# Patient Record
Sex: Female | Born: 1985
Health system: Southern US, Community
[De-identification: ages and names within clinical notes are randomized; demographics above are authoritative.]

## PROBLEM LIST (undated history)

## (undated) DIAGNOSIS — M549 Dorsalgia, unspecified: Secondary | ICD-10-CM

## (undated) DIAGNOSIS — J45909 Unspecified asthma, uncomplicated: Secondary | ICD-10-CM

## (undated) DIAGNOSIS — G43909 Migraine, unspecified, not intractable, without status migrainosus: Secondary | ICD-10-CM

## (undated) DIAGNOSIS — K3184 Gastroparesis: Secondary | ICD-10-CM

## (undated) DIAGNOSIS — G8929 Other chronic pain: Secondary | ICD-10-CM

## (undated) HISTORY — DX: Other chronic pain: G89.29

## (undated) HISTORY — DX: Dorsalgia, unspecified: M54.9

## (undated) HISTORY — DX: Migraine, unspecified, not intractable, without status migrainosus: G43.909

## (undated) HISTORY — PX: WISDOM TOOTH EXTRACTION: SHX21

## (undated) HISTORY — PX: CYST EXCISION: SHX5701

## (undated) HISTORY — PX: OTHER SURGICAL HISTORY: SHX169

## (undated) HISTORY — DX: Unspecified asthma, uncomplicated: J45.909

---

## 2011-07-01 ENCOUNTER — Emergency Department (HOSPITAL_COMMUNITY): Payer: BC Managed Care – PPO

## 2011-07-01 ENCOUNTER — Emergency Department (HOSPITAL_COMMUNITY)
Admission: EM | Admit: 2011-07-01 | Discharge: 2011-07-01 | Disposition: A | Discharge status: Home / Self Care | Payer: BC Managed Care – PPO | Attending: Emergency Medicine | Admitting: Emergency Medicine

## 2011-07-01 DIAGNOSIS — R05 Cough: Secondary | ICD-10-CM | POA: Insufficient documentation

## 2011-07-01 DIAGNOSIS — R059 Cough, unspecified: Secondary | ICD-10-CM | POA: Insufficient documentation

## 2011-07-01 DIAGNOSIS — J40 Bronchitis, not specified as acute or chronic: Secondary | ICD-10-CM | POA: Insufficient documentation

## 2011-07-01 DIAGNOSIS — R0602 Shortness of breath: Secondary | ICD-10-CM | POA: Insufficient documentation

## 2013-02-04 ENCOUNTER — Other Ambulatory Visit: Payer: Self-pay | Admitting: Orthopedic Surgery

## 2013-02-04 DIAGNOSIS — M545 Low back pain, unspecified: Secondary | ICD-10-CM

## 2013-02-05 ENCOUNTER — Other Ambulatory Visit: Payer: BC Managed Care – PPO

## 2013-02-06 ENCOUNTER — Ambulatory Visit
Admission: RE | Admit: 2013-02-06 | Discharge: 2013-02-06 | Disposition: A | Discharge status: Home / Self Care | Payer: BC Managed Care – PPO | Source: Ambulatory Visit | Attending: Orthopedic Surgery | Admitting: Orthopedic Surgery

## 2013-02-06 DIAGNOSIS — M545 Low back pain, unspecified: Secondary | ICD-10-CM

## 2014-07-30 ENCOUNTER — Emergency Department (HOSPITAL_COMMUNITY): Payer: BC Managed Care – PPO

## 2014-07-30 ENCOUNTER — Encounter (HOSPITAL_COMMUNITY): Payer: Self-pay | Admitting: Emergency Medicine

## 2014-07-30 ENCOUNTER — Emergency Department (HOSPITAL_COMMUNITY)
Admission: EM | Admit: 2014-07-30 | Discharge: 2014-07-30 | Disposition: A | Discharge status: Home / Self Care | Payer: BC Managed Care – PPO | Attending: Emergency Medicine | Admitting: Emergency Medicine

## 2014-07-30 DIAGNOSIS — R1012 Left upper quadrant pain: Secondary | ICD-10-CM | POA: Diagnosis present

## 2014-07-30 DIAGNOSIS — R112 Nausea with vomiting, unspecified: Secondary | ICD-10-CM | POA: Insufficient documentation

## 2014-07-30 DIAGNOSIS — M549 Dorsalgia, unspecified: Secondary | ICD-10-CM | POA: Insufficient documentation

## 2014-07-30 DIAGNOSIS — Z79899 Other long term (current) drug therapy: Secondary | ICD-10-CM | POA: Insufficient documentation

## 2014-07-30 DIAGNOSIS — J45909 Unspecified asthma, uncomplicated: Secondary | ICD-10-CM | POA: Insufficient documentation

## 2014-07-30 DIAGNOSIS — IMO0002 Reserved for concepts with insufficient information to code with codable children: Secondary | ICD-10-CM | POA: Diagnosis not present

## 2014-07-30 DIAGNOSIS — Z3202 Encounter for pregnancy test, result negative: Secondary | ICD-10-CM | POA: Diagnosis not present

## 2014-07-30 DIAGNOSIS — Z8719 Personal history of other diseases of the digestive system: Secondary | ICD-10-CM | POA: Diagnosis not present

## 2014-07-30 HISTORY — DX: Gastroparesis: K31.84

## 2014-07-30 HISTORY — DX: Unspecified asthma, uncomplicated: J45.909

## 2014-07-30 LAB — CBC WITH DIFFERENTIAL/PLATELET
Basophils Absolute: 0 10*3/uL (ref 0.0–0.1)
Basophils Relative: 0 % (ref 0–1)
Eosinophils Absolute: 0.1 10*3/uL (ref 0.0–0.7)
Eosinophils Relative: 1 % (ref 0–5)
HCT: 36.3 % (ref 36.0–46.0)
Hemoglobin: 11.8 g/dL — ABNORMAL LOW (ref 12.0–15.0)
Lymphocytes Relative: 24 % (ref 12–46)
Lymphs Abs: 2 10*3/uL (ref 0.7–4.0)
MCH: 28.5 pg (ref 26.0–34.0)
MCHC: 32.5 g/dL (ref 30.0–36.0)
MCV: 87.7 fL (ref 78.0–100.0)
Monocytes Absolute: 0.5 10*3/uL (ref 0.1–1.0)
Monocytes Relative: 6 % (ref 3–12)
Neutro Abs: 5.9 10*3/uL (ref 1.7–7.7)
Neutrophils Relative %: 69 % (ref 43–77)
Platelets: 258 10*3/uL (ref 150–400)
RBC: 4.14 MIL/uL (ref 3.87–5.11)
RDW: 12.5 % (ref 11.5–15.5)
WBC: 8.5 10*3/uL (ref 4.0–10.5)

## 2014-07-30 LAB — COMPREHENSIVE METABOLIC PANEL
ALT: 9 U/L (ref 0–35)
AST: 11 U/L (ref 0–37)
Albumin: 4 g/dL (ref 3.5–5.2)
Alkaline Phosphatase: 55 U/L (ref 39–117)
Anion gap: 17 — ABNORMAL HIGH (ref 5–15)
BUN: 12 mg/dL (ref 6–23)
CO2: 23 mEq/L (ref 19–32)
Calcium: 9.6 mg/dL (ref 8.4–10.5)
Chloride: 98 mEq/L (ref 96–112)
Creatinine, Ser: 0.76 mg/dL (ref 0.50–1.10)
GFR calc Af Amer: >90 mL/min (ref >90)
GFR calc non Af Amer: >90 mL/min (ref >90)
Glucose, Bld: 85 mg/dL (ref 70–99)
Potassium: 3.3 mEq/L — ABNORMAL LOW (ref 3.7–5.3)
Sodium: 138 mEq/L (ref 137–147)
Total Bilirubin: <0.2 mg/dL — ABNORMAL LOW (ref 0.3–1.2)
Total Protein: 7.6 g/dL (ref 6.0–8.3)

## 2014-07-30 LAB — URINALYSIS, ROUTINE W REFLEX MICROSCOPIC
Glucose, UA: NEGATIVE mg/dL
Hgb urine dipstick: NEGATIVE
Ketones, ur: NEGATIVE mg/dL
Nitrite: NEGATIVE
Protein, ur: NEGATIVE mg/dL
Specific Gravity, Urine: 1.029 (ref 1.005–1.030)
Urobilinogen, UA: 1 mg/dL (ref 0.0–1.0)
pH: 6 (ref 5.0–8.0)

## 2014-07-30 LAB — POC URINE PREG, ED: Preg Test, Ur: NEGATIVE

## 2014-07-30 LAB — URINE MICROSCOPIC-ADD ON

## 2014-07-30 LAB — LIPASE, BLOOD: Lipase: 30 U/L (ref 11–59)

## 2014-07-30 MED ORDER — KETOROLAC TROMETHAMINE 60 MG/2ML IM SOLN
60.0000 mg | Freq: Once | INTRAMUSCULAR | Status: AC
  Administered 2014-07-30: 60 mg via INTRAMUSCULAR
  Filled 2014-07-30: qty 2

## 2014-07-30 MED ORDER — ONDANSETRON 8 MG PO TBDP
8.0000 mg | ORAL_TABLET | Freq: Once | ORAL | Status: AC
  Administered 2014-07-30: 8 mg via ORAL
  Filled 2014-07-30: qty 1

## 2014-07-30 MED ORDER — HYDROCODONE-ACETAMINOPHEN 5-325 MG PO TABS
1.0000 | ORAL_TABLET | Freq: Once | ORAL | Status: AC
  Administered 2014-07-30: 1 via ORAL
  Filled 2014-07-30: qty 1

## 2014-07-30 MED ORDER — ONDANSETRON HCL 4 MG PO TABS: 4.0000 mg | ORAL_TABLET | Freq: Three times a day (TID) | ORAL | Status: DC | PRN

## 2014-07-30 MED ORDER — ONDANSETRON HCL 4 MG/2ML IJ SOLN
4.0000 mg | Freq: Once | INTRAMUSCULAR | Status: AC
  Administered 2014-07-30: 4 mg via INTRAVENOUS
  Filled 2014-07-30: qty 2

## 2014-07-30 MED ORDER — IOHEXOL 300 MG/ML  SOLN
50.0000 mL | Freq: Once | INTRAMUSCULAR | Status: AC | PRN
  Administered 2014-07-30: 50 mL via ORAL

## 2014-07-30 MED ORDER — ONDANSETRON 4 MG PO TBDP: ORAL_TABLET | ORAL | Status: DC

## 2014-07-30 MED ORDER — HYDROCODONE-ACETAMINOPHEN 5-325 MG PO TABS: ORAL_TABLET | ORAL | Status: DC

## 2014-07-30 MED ORDER — IOHEXOL 300 MG/ML  SOLN
100.0000 mL | Freq: Once | INTRAMUSCULAR | Status: AC | PRN
  Administered 2014-07-30: 100 mL via INTRAVENOUS

## 2014-07-30 NOTE — ED Notes (Signed)
Patient is aware that we need a urine specimen- patient is currently drinking contrast for CT scan. Will let us know

## 2014-07-30 NOTE — ED Provider Notes (Signed)
Medical screening examination/treatment/procedure(s) were performed by non-physician practitioner and as supervising physician I was immediately available for consultation/collaboration.   EKG Interpretation None        Layla Maw Sahej Hauswirth, DO 07/30/14 1941

## 2014-07-30 NOTE — ED Provider Notes (Signed)
PROGRESS NOTE                                                                                                                 This is a sign-out from PA [employer] [place] at shift change: Michelle Braun is a 28 y.o. female presenting with LUQ abd pain and 4 days of vomitting. Plan is to f/u CT and PO challenge.  Please refer to previous note for full HPI, ROS, PMH and PE.   CT with no abnormalities. Patient seen and examined at the bedside, she is resting comfortably, in no pain. Heart is a regular rate and rhythm with no murmurs rubs or gallops, lung sounds are clear to auscultation bilaterally, abdominal exam is nontender to palpation of any quadrant, she has normal active bowel sounds, no guarding or rebound. No CVA tenderness bilaterally. Advised patient to follow closely with both primary care and her gastroenterologist at [facility]. Patient denies any dysuria, hematuria, urinary frequency. Patient was highly contaminated urinalysis, no signs or symptoms of UTI. WIll culture her urine, defer treatment. =  Filed Vitals:   07/30/14 1326  BP: 115/64  Pulse: 96  Temp: 97.8 F (36.6 C)  TempSrc: Oral  Resp: 18  SpO2: 100%    Medications  ondansetron (ZOFRAN) injection 4 mg (4 mg Intravenous Given 07/30/14 1502)  ketorolac (TORADOL) injection 60 mg (60 mg Intramuscular Given 07/30/14 1502)  iohexol (OMNIPAQUE) 300 MG/ML solution 50 mL (50 mLs Oral Contrast Given 07/30/14 1636)  ondansetron (ZOFRAN) injection 4 mg (4 mg Intravenous Given 07/30/14 1636)  iohexol (OMNIPAQUE) 300 MG/ML solution 100 mL (100 mLs Intravenous Contrast Given 07/30/14 1643)    Evaluation does not show pathology that would require ongoing emergent intervention or inpatient treatment. Pt is hemodynamically stable and mentating appropriately. Discussed findings and plan with patient/guardian, who agrees with care plan. All questions answered. Return precautions discussed and outpatient follow up given.   New Prescriptions   HYDROCODONE-ACETAMINOPHEN (NORCO/VICODIN) 5-325 MG PER TABLET    Take 1-2 tablets by mouth every 6 hours as needed for pain.            Wynetta Emery, PA-C 07/30/14 1735

## 2014-07-30 NOTE — ED Notes (Signed)
Pt states she can not void at this time due to the fact that she " hasn't really had any fluids."

## 2014-07-30 NOTE — ED Notes (Signed)
Pt c/o LUQ pain x 1 month w/ vomiting.  Has been seeing a doc but has not been dx w/ anything.  Has an appt w/ GI on Friday but was told by her PCP to come to the ER d/t pain.

## 2014-07-30 NOTE — ED Provider Notes (Signed)
CSN: [id number]     Arrival date & time 07/30/14  1301 History   First MD Initiated Contact with Patient 07/30/14 1427     Chief Complaint  Patient presents with  . Abdominal Pain  . Emesis    HPI Michelle Braun is a 28 y.o. female with PMH of gastroparesis who complaining of intermittent crampy diffuse abdominal pain which has become sharp, persistent and is worsening today. Pain is in LUQ, it does not radiate. He has nausea and vomiting as well and has not been able to keep food or drink down since Monday. She can't remember the last time she had a BM. Pain is not increased with food, breathing or movement. She has taken ondansetron, promethazine, nexium, and reglan without relief. Carafe helps with the abdominal pain. She has had gastroparesis since she was 28 years old and has had it every 7 years or so and Reglan has helped but it is not helping today. Patient has GI appointment on Friday with GI. Patient without vaginal discharge or bleeding. No urinary complaints. She does have chronic back problem for which she takes oxycodone.  Past Medical History  Diagnosis Date  . Gastroparesis   . Asthma    History reviewed. No pertinent past surgical history. History reviewed. No pertinent family history. History  Substance Use Topics  . Smoking status: Never Smoker   . Smokeless tobacco: Not on file  . Alcohol Use: No   OB History   Grav Para Term Preterm Abortions TAB SAB Ect Mult Living                 Review of Systems  Constitutional: Negative for fever and chills.  HENT: Negative for congestion and rhinorrhea.   Eyes: Negative for visual disturbance.  Respiratory: Negative for cough and shortness of breath.   Cardiovascular: Negative for palpitations.  Gastrointestinal: Positive for nausea, vomiting and abdominal pain. Negative for diarrhea, blood in stool and anal bleeding.  Genitourinary: Negative for dysuria and hematuria.  Musculoskeletal: Positive for back pain. Negative  for gait problem.  Skin: Negative for rash.  Neurological: Negative for weakness and headaches.      Allergies  Tramadol  Home Medications   Prior to Admission medications   Medication Sig Start Date End Date Taking? Authorizing Provider  albuterol (PROVENTIL HFA;VENTOLIN HFA) 108 (90 BASE) MCG/ACT inhaler Inhale 2 puffs into the lungs every 4 (four) hours as needed for wheezing or shortness of breath.   Yes Historical Provider, MD  atenolol (TENORMIN) 50 MG tablet Take 50 mg by mouth daily as needed (blood pressure).   Yes Historical Provider, MD  beclomethasone (QVAR) 80 MCG/ACT inhaler Inhale 2 puffs into the lungs 2 (two) times daily.   Yes Historical Provider, MD  Bepotastine Besilate (BEPREVE) 1.5 % SOLN Apply 1 drop to eye 2 (two) times daily.   Yes Historical Provider, MD  buPROPion (WELLBUTRIN XL) 300 MG 24 hr tablet Take 300 mg by mouth every morning.   Yes Historical Provider, MD  cyclobenzaprine (FLEXERIL) 10 MG tablet Take 10 mg by mouth 3 (three) times daily as needed for muscle spasms.   Yes Historical Provider, MD  EPINEPHrine (EPIPEN) 0.3 mg/0.3 mL IJ SOAJ injection Inject 0.3 mg into the muscle once as needed (allergic reaction.).   Yes Historical Provider, MD  esomeprazole (NEXIUM) 40 MG capsule Take 40 mg by mouth daily at 12 noon.   Yes Historical Provider, MD  etonogestrel-ethinyl estradiol (NUVARING) 0.12-0.015 MG/24HR vaginal ring Place 1 each  vaginally every 28 (twenty-eight) days. Insert vaginally and leave in place for 3 consecutive weeks, then remove for 1 week.   Yes Historical Provider, MD  levocetirizine (XYZAL) 5 MG tablet Take 5 mg by mouth every evening.   Yes Historical Provider, MD  lidocaine (LIDODERM) 5 % Place 1-3 patches onto the skin daily as needed (pain). Remove & Discard patch within 12 hours or as directed by MD   Yes Historical Provider, MD  lisdexamfetamine (VYVANSE) 60 MG capsule Take 60 mg by mouth every morning.   Yes Historical Provider, MD   meloxicam (MOBIC) 15 MG tablet Take 15 mg by mouth at bedtime.   Yes Historical Provider, MD  metoCLOPramide (REGLAN) 10 MG tablet Take 10 mg by mouth See admin instructions. Thirty minutes before each meal as needed for stomach upset.   Yes Historical Provider, MD  mometasone (NASONEX) 50 MCG/ACT nasal spray Place 2 sprays into the nose daily.   Yes Historical Provider, MD  montelukast (SINGULAIR) 10 MG tablet Take 10 mg by mouth at bedtime.   Yes Historical Provider, MD  ondansetron (ZOFRAN) 8 MG tablet Take 8 mg by mouth every 8 (eight) hours as needed for nausea or vomiting.   Yes Historical Provider, MD  oxyCODONE-acetaminophen (PERCOCET) 7.5-325 MG per tablet Take 1 tablet by mouth every 8 (eight) hours as needed for pain.   Yes Historical Provider, MD  promethazine (PHENERGAN) 25 MG tablet Take 25 mg by mouth every 8 (eight) hours as needed for nausea or vomiting.   Yes Historical Provider, MD  ranitidine (ZANTAC) 75 MG tablet Take 75 mg by mouth daily as needed for heartburn.   Yes Historical Provider, MD  sertraline (ZOLOFT) 100 MG tablet Take 200 mg by mouth every morning.   Yes Historical Provider, MD  sucralfate (CARAFATE) 1 GM/10ML suspension Take 1 g by mouth 4 (four) times daily.   Yes Historical Provider, MD  HYDROcodone-acetaminophen (NORCO/VICODIN) 5-325 MG per tablet Take 1-2 tablets by mouth every 6 hours as needed for pain. 07/30/14   Nicole Pisciotta, PA-C   BP 115/64  Pulse 96  Temp(Src) 97.8 F (36.6 C) (Oral)  Resp 18  SpO2 100%  LMP 06/29/2014 Physical Exam  Nursing note and vitals reviewed. Constitutional: She appears well-developed and well-nourished. No distress.  HENT:  Head: Normocephalic and atraumatic.  Eyes: Conjunctivae and EOM are normal. Right eye exhibits no discharge. Left eye exhibits no discharge. No scleral icterus.  Cardiovascular: Normal rate, regular rhythm and normal heart sounds.   Pulmonary/Chest: Effort normal and breath sounds normal. No  respiratory distress. She has no wheezes.  Abdominal: Soft. Bowel sounds are normal. She exhibits no distension.  LUQ tenderness without guarding, rebound or signs of peritonitis. No CVA tenderness.   Musculoskeletal: Normal range of motion. She exhibits no tenderness.  Neurological: She is alert. She exhibits normal muscle tone. Coordination normal.  Skin: Skin is warm and dry. She is not diaphoretic.  Psychiatric: She has a normal mood and affect. Her behavior is normal.    ED Course  Procedures (including critical care time) Labs Review Labs Reviewed  CBC WITH DIFFERENTIAL - Abnormal; Notable for the following:    Hemoglobin 11.8 (*)    All other components within normal limits  COMPREHENSIVE METABOLIC PANEL - Abnormal; Notable for the following:    Potassium 3.3 (*)    Total Bilirubin <0.2 (*)    Anion gap 17 (*)    All other components within normal limits  URINALYSIS, ROUTINE W  REFLEX MICROSCOPIC - Abnormal; Notable for the following:    Color, Urine AMBER (*)    APPearance CLOUDY (*)    Bilirubin Urine SMALL (*)    Leukocytes, UA LARGE (*)    All other components within normal limits  URINE MICROSCOPIC-ADD ON - Abnormal; Notable for the following:    Squamous Epithelial / LPF MANY (*)    Bacteria, UA FEW (*)    Crystals CA OXALATE CRYSTALS (*)    All other components within normal limits  LIPASE, BLOOD  POC URINE PREG, ED    Imaging Review Ct Abdomen Pelvis W Contrast  07/30/2014   CLINICAL DATA:  Left upper quadrant pain for 1 month vomiting.  EXAM: CT ABDOMEN AND PELVIS WITH CONTRAST  TECHNIQUE: Multidetector CT imaging of the abdomen and pelvis was performed using the standard protocol following bolus administration of intravenous contrast.  CONTRAST:  100 mL OMNIPAQUE IOHEXOL 300 MG/ML  SOLN  COMPARISON:  None.  FINDINGS: The lung bases are clear.  No pleural or pericardial effusion.  The gallbladder, liver, spleen, adrenal glands, pancreas and kidneys appear normal.  Nuvaring is in place in the cervix. Uterus and adnexa are otherwise unremarkable. Urinary bladder appears normal. The stomach, small and large bowel and appendix appear normal. There is no lymphadenopathy or fluid. No bony abnormality is identified.  IMPRESSION: No finding to explain the patient's symptoms. Negative CT abdomen and pelvis.   Electronically Signed   By: Drusilla Kanner M.D.   On: 07/30/2014 16:57     EKG Interpretation None     Meds given in ED:  Medications  ondansetron (ZOFRAN) injection 4 mg (4 mg Intravenous Given 07/30/14 1502)  ketorolac (TORADOL) injection 60 mg (60 mg Intramuscular Given 07/30/14 1502)  iohexol (OMNIPAQUE) 300 MG/ML solution 50 mL (50 mLs Oral Contrast Given 07/30/14 1636)  ondansetron (ZOFRAN) injection 4 mg (4 mg Intravenous Given 07/30/14 1636)  iohexol (OMNIPAQUE) 300 MG/ML solution 100 mL (100 mLs Intravenous Contrast Given 07/30/14 1643)    New Prescriptions   HYDROCODONE-ACETAMINOPHEN (NORCO/VICODIN) 5-325 MG PER TABLET    Take 1-2 tablets by mouth every 6 hours as needed for pain.      MDM   Final diagnoses:  LUQ abdominal pain  Nausea and vomiting, vomiting of unspecified type   Michelle Braun is a 28 y.o. female complaining of LUQ pain that is sharp, persistent and worsening. Patient has had intermittent crampy abdominal pain for a month. She reports three days of nausea and vomiting and has not been able to eat or drink. Patient is afebrile with VSS. Patient in no distress. Patient given zofran and toradol in ED with improvement of nausea and pain. CT of abdomen pelvis pending. Patient with GI follow up appointment scheduled for Friday.  Repeat abdominal exam with decreased tenderness in LUQ. Patient has ingested half of the oral contrast and became very nauseated. Additional dose of zofran administered.  Discussed all results and patient verbalizes understanding and agrees with plan.  1645 Patient signed out to [employer],  PA-C.      Louann Sjogren, PA-C 07/30/14 1728

## 2014-07-30 NOTE — Discharge Instructions (Signed)
Take vicodin for breakthrough pain, do not drink alcohol, drive, care for children or do other critical tasks while taking vicodin. ° °Please follow with your primary care doctor in the next 2 days for a check-up. They must obtain records for further management.  ° °Do not hesitate to return to the Emergency Department for any new, worsening or concerning symptoms.  ° ° °Abdominal Pain, Women °Abdominal (stomach, pelvic, or belly) pain can be caused by many things. It is important to tell your doctor: °· The location of the pain. °· Does it come and go or is it present all the time? °· Are there things that start the pain (eating certain foods, exercise)? °· Are there other symptoms associated with the pain (fever, nausea, vomiting, diarrhea)? °All of this is helpful to know when trying to find the cause of the pain. °CAUSES  °· Stomach: virus or bacteria infection, or ulcer. °· Intestine: appendicitis (inflamed appendix), regional ileitis (Crohn's disease), ulcerative colitis (inflamed colon), irritable bowel syndrome, diverticulitis (inflamed diverticulum of the colon), or cancer of the stomach or intestine. °· Gallbladder disease or stones in the gallbladder. °· Kidney disease, kidney stones, or infection. °· Pancreas infection or cancer. °· Fibromyalgia (pain disorder). °· Diseases of the female organs: °¨ Uterus: fibroid (non-cancerous) tumors or infection. °¨ Fallopian tubes: infection or tubal pregnancy. °¨ Ovary: cysts or tumors. °¨ Pelvic adhesions (scar tissue). °¨ Endometriosis (uterus lining tissue growing in the pelvis and on the pelvic organs). °¨ Pelvic congestion syndrome (female organs filling up with blood just before the menstrual period). °¨ Pain with the menstrual period. °¨ Pain with ovulation (producing an egg). °¨ Pain with an IUD (intrauterine device, birth control) in the uterus. °¨ Cancer of the female organs. °· Functional pain (pain not caused by a disease, may improve without  treatment). °· Psychological pain. °· Depression. °DIAGNOSIS  °Your doctor will decide the seriousness of your pain by doing an examination. °· Blood tests. °· X-rays. °· Ultrasound. °· CT scan (computed tomography, special type of X-ray). °· MRI (magnetic resonance imaging). °· Cultures, for infection. °· Barium enema (dye inserted in the large intestine, to better view it with X-rays). °· Colonoscopy (looking in intestine with a lighted tube). °· Laparoscopy (minor surgery, looking in abdomen with a lighted tube). °· Major abdominal exploratory surgery (looking in abdomen with a large incision). °TREATMENT  °The treatment will depend on the cause of the pain.  °· Many cases can be observed and treated at home. °· Over-the-counter medicines recommended by your caregiver. °· Prescription medicine. °· Antibiotics, for infection. °· Birth control pills, for painful periods or for ovulation pain. °· Hormone treatment, for endometriosis. °· Nerve blocking injections. °· Physical therapy. °· Antidepressants. °· Counseling with a psychologist or psychiatrist. °· Minor or major surgery. °HOME CARE INSTRUCTIONS  °· Do not take laxatives, unless directed by your caregiver. °· Take over-the-counter pain medicine only if ordered by your caregiver. Do not take aspirin because it can cause an upset stomach or bleeding. °· Try a clear liquid diet (broth or water) as ordered by your caregiver. Slowly move to a bland diet, as tolerated, if the pain is related to the stomach or intestine. °· Have a thermometer and take your temperature several times a day, and record it. °· Bed rest and sleep, if it helps the pain. °· Avoid sexual intercourse, if it causes pain. °· Avoid stressful situations. °· Keep your follow-up appointments and tests, as your caregiver orders. °· If   the pain does not go away with medicine or surgery, you may try:  Acupuncture.  Relaxation exercises (yoga, meditation).  Group therapy.  Counseling. SEEK  MEDICAL CARE IF:   You notice certain foods cause stomach pain.  Your home care treatment is not helping your pain.  You need stronger pain medicine.  You want your IUD removed.  You feel faint or lightheaded.  You develop nausea and vomiting.  You develop a rash.  You are having side effects or an allergy to your medicine. SEEK IMMEDIATE MEDICAL CARE IF:   Your pain does not go away or gets worse.  You have a fever.  Your pain is felt only in portions of the abdomen. The right side could possibly be appendicitis. The left lower portion of the abdomen could be colitis or diverticulitis.  You are passing blood in your stools (bright red or black tarry stools, with or without vomiting).  You have blood in your urine.  You develop chills, with or without a fever.  You pass out. MAKE SURE YOU:   Understand these instructions.  Will watch your condition.  Will get help right away if you are not doing well or get worse. Document Released: 09/18/2007 Document Revised: 04/07/2014 Document Reviewed: 10/08/2009 [facility] Patient Information 2015 [place], [place]. This information is not intended to replace advice given to you by your health care provider. Make sure you discuss any questions you have with your health care provider.

## 2014-07-31 LAB — URINE CULTURE: Colony Count: >=100000

## 2014-08-01 ENCOUNTER — Other Ambulatory Visit (HOSPITAL_COMMUNITY): Payer: Self-pay | Admitting: Gastroenterology

## 2014-08-01 DIAGNOSIS — R109 Unspecified abdominal pain: Secondary | ICD-10-CM

## 2014-08-01 NOTE — ED Provider Notes (Signed)
Medical screening examination/treatment/procedure(s) were performed by non-physician practitioner and as supervising physician I was immediately available for consultation/collaboration.   EKG Interpretation None       Toy Baker, MD 08/01/14 [phone]

## 2014-08-05 ENCOUNTER — Ambulatory Visit (HOSPITAL_COMMUNITY)
Admission: RE | Admit: 2014-08-05 | Discharge: 2014-08-05 | Disposition: A | Discharge status: Home / Self Care | Payer: BC Managed Care – PPO | Source: Ambulatory Visit | Attending: Gastroenterology | Admitting: Gastroenterology

## 2014-08-05 DIAGNOSIS — R1012 Left upper quadrant pain: Secondary | ICD-10-CM | POA: Insufficient documentation

## 2014-08-05 DIAGNOSIS — R935 Abnormal findings on diagnostic imaging of other abdominal regions, including retroperitoneum: Secondary | ICD-10-CM | POA: Diagnosis not present

## 2014-08-05 DIAGNOSIS — R109 Unspecified abdominal pain: Secondary | ICD-10-CM

## 2014-08-05 MED ORDER — TECHNETIUM TC 99M SULFUR COLLOID
2.2000 | Freq: Once | INTRAVENOUS | Status: AC | PRN
  Administered 2014-08-05: 2.2 via ORAL

## 2016-02-02 ENCOUNTER — Other Ambulatory Visit: Payer: Self-pay | Admitting: Anesthesiology

## 2016-02-02 DIAGNOSIS — M545 Low back pain: Secondary | ICD-10-CM

## 2016-03-01 MED FILL — OXYCODONE-ACETAMINOPHEN 10-: 10-325 | 30 days supply | Qty: 120 | Fill #0

## 2016-03-30 MED FILL — OXYCODONE-ACETAMINOPHEN 10-: 10-325 | 30 days supply | Qty: 120 | Fill #0

## 2016-04-06 DIAGNOSIS — R1110 Vomiting, unspecified: Secondary | ICD-10-CM | POA: Diagnosis not present

## 2016-04-26 DIAGNOSIS — Z79899 Other long term (current) drug therapy: Secondary | ICD-10-CM | POA: Diagnosis not present

## 2016-04-26 DIAGNOSIS — M545 Low back pain: Secondary | ICD-10-CM | POA: Diagnosis not present

## 2016-04-26 DIAGNOSIS — G894 Chronic pain syndrome: Secondary | ICD-10-CM | POA: Diagnosis not present

## 2016-04-26 DIAGNOSIS — M461 Sacroiliitis, not elsewhere classified: Secondary | ICD-10-CM | POA: Diagnosis not present

## 2016-04-28 MED FILL — OXYCODONE-ACETAMINOPHEN 10-: 10-325 | 30 days supply | Qty: 120 | Fill #0

## 2016-05-23 DIAGNOSIS — M5416 Radiculopathy, lumbar region: Secondary | ICD-10-CM | POA: Diagnosis not present

## 2016-05-23 DIAGNOSIS — M4806 Spinal stenosis, lumbar region: Secondary | ICD-10-CM | POA: Diagnosis not present

## 2016-05-23 DIAGNOSIS — M5126 Other intervertebral disc displacement, lumbar region: Secondary | ICD-10-CM | POA: Diagnosis not present

## 2016-05-23 DIAGNOSIS — M47816 Spondylosis without myelopathy or radiculopathy, lumbar region: Secondary | ICD-10-CM | POA: Diagnosis not present

## 2016-05-27 DIAGNOSIS — M7062 Trochanteric bursitis, left hip: Secondary | ICD-10-CM | POA: Diagnosis not present

## 2016-05-27 DIAGNOSIS — M4807 Spinal stenosis, lumbosacral region: Secondary | ICD-10-CM | POA: Diagnosis not present

## 2016-05-27 DIAGNOSIS — M5416 Radiculopathy, lumbar region: Secondary | ICD-10-CM | POA: Diagnosis not present

## 2016-05-27 DIAGNOSIS — M5126 Other intervertebral disc displacement, lumbar region: Secondary | ICD-10-CM | POA: Diagnosis not present

## 2016-05-27 DIAGNOSIS — M5117 Intervertebral disc disorders with radiculopathy, lumbosacral region: Secondary | ICD-10-CM | POA: Diagnosis not present

## 2016-06-01 DIAGNOSIS — F902 Attention-deficit hyperactivity disorder, combined type: Secondary | ICD-10-CM | POA: Diagnosis not present

## 2016-06-01 DIAGNOSIS — F331 Major depressive disorder, recurrent, moderate: Secondary | ICD-10-CM | POA: Diagnosis not present

## 2016-06-09 DIAGNOSIS — M461 Sacroiliitis, not elsewhere classified: Secondary | ICD-10-CM | POA: Diagnosis not present

## 2016-06-09 DIAGNOSIS — M533 Sacrococcygeal disorders, not elsewhere classified: Secondary | ICD-10-CM | POA: Diagnosis not present

## 2016-06-09 DIAGNOSIS — M47818 Spondylosis without myelopathy or radiculopathy, sacral and sacrococcygeal region: Secondary | ICD-10-CM | POA: Diagnosis not present

## 2016-06-10 DIAGNOSIS — Z6829 Body mass index (BMI) 29.0-29.9, adult: Secondary | ICD-10-CM | POA: Diagnosis not present

## 2016-06-10 DIAGNOSIS — Z124 Encounter for screening for malignant neoplasm of cervix: Secondary | ICD-10-CM | POA: Diagnosis not present

## 2016-06-10 DIAGNOSIS — Z01419 Encounter for gynecological examination (general) (routine) without abnormal findings: Secondary | ICD-10-CM | POA: Diagnosis not present

## 2016-06-21 DIAGNOSIS — G894 Chronic pain syndrome: Secondary | ICD-10-CM | POA: Diagnosis not present

## 2016-06-21 DIAGNOSIS — M461 Sacroiliitis, not elsewhere classified: Secondary | ICD-10-CM | POA: Diagnosis not present

## 2016-06-21 DIAGNOSIS — M545 Low back pain: Secondary | ICD-10-CM | POA: Diagnosis not present

## 2016-06-21 DIAGNOSIS — Z79899 Other long term (current) drug therapy: Secondary | ICD-10-CM | POA: Diagnosis not present

## 2016-06-23 MED FILL — OXYCODONE-APAP 10-325 TAB: 10-325 | 30 days supply | Qty: 120 | Fill #0

## 2016-07-21 MED FILL — OXYCODONE-APAP 10-325 TAB: 10-325 | 30 days supply | Qty: 120 | Fill #0

## 2016-08-15 DIAGNOSIS — Z79899 Other long term (current) drug therapy: Secondary | ICD-10-CM | POA: Diagnosis not present

## 2016-08-15 DIAGNOSIS — M461 Sacroiliitis, not elsewhere classified: Secondary | ICD-10-CM | POA: Diagnosis not present

## 2016-08-15 DIAGNOSIS — G894 Chronic pain syndrome: Secondary | ICD-10-CM | POA: Diagnosis not present

## 2016-08-15 DIAGNOSIS — M545 Low back pain: Secondary | ICD-10-CM | POA: Diagnosis not present

## 2016-08-19 MED FILL — OXYCODONE-APAP 10-325 TAB: 10-325 | 30 days supply | Qty: 120 | Fill #0

## 2016-08-26 DIAGNOSIS — M461 Sacroiliitis, not elsewhere classified: Secondary | ICD-10-CM | POA: Diagnosis not present

## 2016-08-26 DIAGNOSIS — M5440 Lumbago with sciatica, unspecified side: Secondary | ICD-10-CM | POA: Diagnosis not present

## 2016-09-15 DIAGNOSIS — M461 Sacroiliitis, not elsewhere classified: Secondary | ICD-10-CM | POA: Diagnosis not present

## 2016-09-15 DIAGNOSIS — M545 Low back pain: Secondary | ICD-10-CM | POA: Diagnosis not present

## 2016-09-15 DIAGNOSIS — G894 Chronic pain syndrome: Secondary | ICD-10-CM | POA: Diagnosis not present

## 2016-09-15 DIAGNOSIS — Z79899 Other long term (current) drug therapy: Secondary | ICD-10-CM | POA: Diagnosis not present

## 2016-09-15 MED FILL — OXYCODONE-APAP 10-325 TAB: 10-325 | 30 days supply | Qty: 120 | Fill #0

## 2016-09-26 DIAGNOSIS — M4807 Spinal stenosis, lumbosacral region: Secondary | ICD-10-CM | POA: Diagnosis not present

## 2016-09-26 DIAGNOSIS — M5416 Radiculopathy, lumbar region: Secondary | ICD-10-CM | POA: Diagnosis not present

## 2016-09-26 DIAGNOSIS — M5127 Other intervertebral disc displacement, lumbosacral region: Secondary | ICD-10-CM | POA: Diagnosis not present

## 2016-09-26 DIAGNOSIS — M79604 Pain in right leg: Secondary | ICD-10-CM | POA: Diagnosis not present

## 2016-09-26 DIAGNOSIS — M533 Sacrococcygeal disorders, not elsewhere classified: Secondary | ICD-10-CM | POA: Diagnosis not present

## 2016-09-26 DIAGNOSIS — M5117 Intervertebral disc disorders with radiculopathy, lumbosacral region: Secondary | ICD-10-CM | POA: Diagnosis not present

## 2016-09-26 DIAGNOSIS — M5126 Other intervertebral disc displacement, lumbar region: Secondary | ICD-10-CM | POA: Diagnosis not present

## 2016-09-27 DIAGNOSIS — M461 Sacroiliitis, not elsewhere classified: Secondary | ICD-10-CM | POA: Diagnosis not present

## 2016-09-27 DIAGNOSIS — M47816 Spondylosis without myelopathy or radiculopathy, lumbar region: Secondary | ICD-10-CM | POA: Diagnosis not present

## 2016-09-27 DIAGNOSIS — M5416 Radiculopathy, lumbar region: Secondary | ICD-10-CM | POA: Diagnosis not present

## 2016-09-27 DIAGNOSIS — M5126 Other intervertebral disc displacement, lumbar region: Secondary | ICD-10-CM | POA: Diagnosis not present

## 2016-09-27 DIAGNOSIS — M545 Low back pain: Secondary | ICD-10-CM | POA: Diagnosis not present

## 2016-09-27 DIAGNOSIS — M533 Sacrococcygeal disorders, not elsewhere classified: Secondary | ICD-10-CM | POA: Diagnosis not present

## 2016-10-03 DIAGNOSIS — M545 Low back pain: Secondary | ICD-10-CM | POA: Diagnosis not present

## 2016-10-03 DIAGNOSIS — Z01818 Encounter for other preprocedural examination: Secondary | ICD-10-CM | POA: Diagnosis not present

## 2016-10-10 ENCOUNTER — Other Ambulatory Visit: Payer: Self-pay | Admitting: Physician Assistant

## 2016-10-10 DIAGNOSIS — R109 Unspecified abdominal pain: Secondary | ICD-10-CM

## 2016-10-12 MED FILL — OXYCODONE-APAP 10-325 TAB: 10-325 | 30 days supply | Qty: 120 | Fill #0

## 2016-10-18 ENCOUNTER — Other Ambulatory Visit: Payer: Self-pay

## 2016-10-19 ENCOUNTER — Inpatient Hospital Stay: Admission: RE | Admit: 2016-10-19 | Payer: Self-pay | Source: Ambulatory Visit

## 2016-10-25 DIAGNOSIS — M461 Sacroiliitis, not elsewhere classified: Secondary | ICD-10-CM | POA: Diagnosis not present

## 2016-10-25 DIAGNOSIS — M5416 Radiculopathy, lumbar region: Secondary | ICD-10-CM | POA: Diagnosis not present

## 2016-10-25 DIAGNOSIS — M545 Low back pain: Secondary | ICD-10-CM | POA: Diagnosis not present

## 2016-10-31 ENCOUNTER — Ambulatory Visit
Admission: RE | Admit: 2016-10-31 | Discharge: 2016-10-31 | Disposition: A | Discharge status: Home / Self Care | Payer: BLUE CROSS/BLUE SHIELD | Source: Ambulatory Visit | Attending: Physician Assistant | Admitting: Physician Assistant

## 2016-10-31 ENCOUNTER — Other Ambulatory Visit: Payer: Self-pay

## 2016-10-31 DIAGNOSIS — M533 Sacrococcygeal disorders, not elsewhere classified: Secondary | ICD-10-CM | POA: Diagnosis not present

## 2016-10-31 DIAGNOSIS — R109 Unspecified abdominal pain: Secondary | ICD-10-CM

## 2016-11-09 DIAGNOSIS — F419 Anxiety disorder, unspecified: Secondary | ICD-10-CM | POA: Diagnosis not present

## 2016-11-09 DIAGNOSIS — S0501XA Injury of conjunctiva and corneal abrasion without foreign body, right eye, initial encounter: Secondary | ICD-10-CM | POA: Diagnosis not present

## 2016-11-09 DIAGNOSIS — R112 Nausea with vomiting, unspecified: Secondary | ICD-10-CM | POA: Diagnosis not present

## 2016-11-09 MED FILL — OXYCODONE-APAP 10-325 TAB: 10-325 | 30 days supply | Qty: 120 | Fill #0

## 2016-11-14 DIAGNOSIS — M533 Sacrococcygeal disorders, not elsewhere classified: Secondary | ICD-10-CM | POA: Diagnosis not present

## 2016-11-14 DIAGNOSIS — M461 Sacroiliitis, not elsewhere classified: Secondary | ICD-10-CM | POA: Diagnosis not present

## 2016-11-14 HISTORY — PX: BACK SURGERY: SHX140

## 2016-11-24 DIAGNOSIS — Z981 Arthrodesis status: Secondary | ICD-10-CM | POA: Diagnosis not present

## 2016-12-06 DIAGNOSIS — M48061 Spinal stenosis, lumbar region without neurogenic claudication: Secondary | ICD-10-CM | POA: Diagnosis not present

## 2016-12-06 DIAGNOSIS — M5416 Radiculopathy, lumbar region: Secondary | ICD-10-CM | POA: Diagnosis not present

## 2016-12-06 DIAGNOSIS — M5126 Other intervertebral disc displacement, lumbar region: Secondary | ICD-10-CM | POA: Diagnosis not present

## 2016-12-06 DIAGNOSIS — M47816 Spondylosis without myelopathy or radiculopathy, lumbar region: Secondary | ICD-10-CM | POA: Diagnosis not present

## 2016-12-08 DIAGNOSIS — Z981 Arthrodesis status: Secondary | ICD-10-CM | POA: Diagnosis not present

## 2016-12-08 DIAGNOSIS — F902 Attention-deficit hyperactivity disorder, combined type: Secondary | ICD-10-CM | POA: Diagnosis not present

## 2016-12-08 DIAGNOSIS — F331 Major depressive disorder, recurrent, moderate: Secondary | ICD-10-CM | POA: Diagnosis not present

## 2016-12-20 MED FILL — OXYCODONE-APAP 10-325 TAB: 10-325 | 30 days supply | Qty: 90 | Fill #0

## 2017-01-16 MED FILL — OXYCODONE-APAP 10-325 TAB: 10-325 | 15 days supply | Qty: 45 | Fill #0

## 2017-01-27 DIAGNOSIS — G894 Chronic pain syndrome: Secondary | ICD-10-CM | POA: Diagnosis not present

## 2017-01-27 DIAGNOSIS — Z79899 Other long term (current) drug therapy: Secondary | ICD-10-CM | POA: Diagnosis not present

## 2017-01-27 DIAGNOSIS — Z5181 Encounter for therapeutic drug level monitoring: Secondary | ICD-10-CM | POA: Diagnosis not present

## 2017-01-27 DIAGNOSIS — M533 Sacrococcygeal disorders, not elsewhere classified: Secondary | ICD-10-CM | POA: Diagnosis not present

## 2017-01-27 MED FILL — MORPHINE SULF ER 15 MG TAB: 15 | 30 days supply | Qty: 60 | Fill #0

## 2017-01-27 MED FILL — GABAPENTIN 300 MG CAPSULE: 300 | 30 days supply | Qty: 90 | Fill #0

## 2017-01-29 MED FILL — OXYCODONE-APAP 10-325 TAB: 10-325 | 30 days supply | Qty: 90 | Fill #0

## 2017-02-15 DIAGNOSIS — M533 Sacrococcygeal disorders, not elsewhere classified: Secondary | ICD-10-CM | POA: Diagnosis not present

## 2017-02-15 DIAGNOSIS — G894 Chronic pain syndrome: Secondary | ICD-10-CM | POA: Diagnosis not present

## 2017-02-20 DIAGNOSIS — H539 Unspecified visual disturbance: Secondary | ICD-10-CM | POA: Diagnosis not present

## 2017-02-20 DIAGNOSIS — G43909 Migraine, unspecified, not intractable, without status migrainosus: Secondary | ICD-10-CM | POA: Diagnosis not present

## 2017-02-20 DIAGNOSIS — R404 Transient alteration of awareness: Secondary | ICD-10-CM | POA: Diagnosis not present

## 2017-02-23 ENCOUNTER — Ambulatory Visit (INDEPENDENT_AMBULATORY_CARE_PROVIDER_SITE_OTHER): Payer: BLUE CROSS/BLUE SHIELD | Admitting: Neurology

## 2017-02-23 ENCOUNTER — Encounter: Payer: Self-pay | Admitting: Neurology

## 2017-02-23 DIAGNOSIS — IMO0002 Reserved for concepts with insufficient information to code with codable children: Secondary | ICD-10-CM | POA: Insufficient documentation

## 2017-02-23 DIAGNOSIS — G43709 Chronic migraine without aura, not intractable, without status migrainosus: Secondary | ICD-10-CM | POA: Diagnosis not present

## 2017-02-23 MED ORDER — RIZATRIPTAN BENZOATE 10 MG PO TBDP: 10.0000 mg | ORAL_TABLET | ORAL | 6 refills | Status: DC | PRN

## 2017-02-23 MED ORDER — TOPIRAMATE 50 MG PO TABS: 100.0000 mg | ORAL_TABLET | Freq: Every day | ORAL | 11 refills | Status: DC

## 2017-02-23 NOTE — Progress Notes (Signed)
PATIENT: Michelle Braun DOB: 14-Aug-1986  Chief Complaint  Patient presents with  . Migraine    She estimates 15 headache days over the last month.  States this is the first migraine she has experienced since she was 31 years old.  She is not currently taking anything for pain.  She has tried Maxalt and Axert in the past.   . PCP    Michelle Saupeammie Fulp, MD - referred by PCP office     HISTORICAL  Michelle Braun is a 31 years old right handed female, seen in refer by Her primary care doctor Michelle Braun, for evaluation of migraine headaches, initial evaluation was on February 23 2017.  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  Her migraines usually clustered around her menstruation period of time, she is she started contraceptive treatment from puberty, her migraine is under much better control, she had IUD placement in 2017, 13 days ago, in mid March 2018, she had her first period in 18 months. She reported heavy flow, concurrent with her menstruation, she has developed different kind of migraine,  Her typical migraine are right retro-orbital area severe pounding headache with associated light noise sensitivity, nauseous, relieved by sleep. This time, her migraine is at left retro-orbital area, also with left visual field deficit, left worse than right eye. which has been persistent over the past 13 days, her migraines would improve his overnight sleep, but gradually building up again next day, she has not taking any over-the-counter medications.  Previously axert and Maxalt work well for her migraine headaches,  She is now complains of left side  5/10 pressure headache, continued mild left visual field deficit,  REVIEW OF SYSTEMS: Full 14 system review of systems performed and notable only for as above  ALLERGIES: Allergies  Allergen Reactions  . Tramadol     anaphylaxis    HOME MEDICATIONS: Current Outpatient Prescriptions  Medication Sig Dispense Refill  . albuterol (PROVENTIL HFA;VENTOLIN HFA) 108 (90 BASE) MCG/ACT inhaler Inhale 2 puffs into the lungs every 4 (four) hours as needed for wheezing or shortness of breath.    Marland Kitchen. atenolol (TENORMIN) 50 MG tablet Take 50 mg by mouth daily as needed (blood pressure).    . beclomethasone (QVAR) 80 MCG/ACT inhaler Inhale 2 puffs into the lungs 2 (two) times daily.    . Bepotastine Besilate (BEPREVE) 1.5 % SOLN Apply 1 drop to eye 2 (two) times daily.    Marland Kitchen. buPROPion (WELLBUTRIN XL) 300 MG 24 hr tablet Take 300 mg by mouth every morning.    . cyclobenzaprine (FLEXERIL) 10 MG tablet Take 10 mg by mouth 3 (three) times daily as needed for muscle spasms.    Marland Kitchen. EPINEPHrine (EPIPEN) 0.3 mg/0.3 mL IJ SOAJ injection Inject 0.3 mg into the muscle once as needed (allergic reaction.).    Marland Kitchen. esomeprazole (NEXIUM) 40 MG capsule Take 40 mg by mouth daily at 12 noon.    Marland Kitchen. levocetirizine (XYZAL) 5 MG tablet Take 5 mg by mouth every evening.    . metoCLOPramide (REGLAN) 10 MG tablet Take 10 mg by mouth See admin instructions. Thirty minutes before each meal as needed for stomach upset.    .Marland Kitchen  mometasone (NASONEX) 50 MCG/ACT nasal spray Place 2 sprays into the nose daily.    . montelukast (SINGULAIR) 10 MG tablet Take 10 mg by mouth at bedtime.    . ondansetron (ZOFRAN) 8 MG tablet Take 8 mg by mouth every 8 (eight) hours as needed for nausea or vomiting.    . promethazine (PHENERGAN) 25 MG tablet Take 25 mg by mouth every 8 (eight) hours as needed for nausea or vomiting.    . ranitidine (ZANTAC) 75 MG tablet Take 75 mg by mouth daily as needed for heartburn.    . sertraline (ZOLOFT) 100 MG tablet Take 200 mg  by mouth every morning.    . sucralfate (CARAFATE) 1 GM/10ML suspension Take 1 g by mouth 4 (four) times daily.     No current facility-administered medications for this visit.     PAST MEDICAL HISTORY: Past Medical History:  Diagnosis Date  . Asthma   . Asthma due to seasonal allergies   . Chronic back pain   . Gastroparesis   . Migraine     PAST SURGICAL HISTORY: Past Surgical History:  Procedure Laterality Date  . BACK SURGERY  11/14/2016   SI Joint fusion - left side  . CYST EXCISION     Left side of head  . mole removed     Chest  . WISDOM TOOTH EXTRACTION      FAMILY HISTORY: Family History  Problem Relation Age of Onset  . Migraines Mother   . Healthy Father   . Heart disease Paternal Grandfather     SOCIAL HISTORY:  Social History   Social History  . Marital status: Single    Spouse name: N/A  . Number of children: 0  . Years of education: IT consultant   Occupational History  . Grad Student    Social History Main Topics  . Smoking status: Never Smoker  . Smokeless tobacco: Never Used  . Alcohol use Yes     Comment: Socially - twice weekly  . Drug use: No  . Sexual activity: Not on file   Other Topics Concern  . Not on file   Social History Narrative   Lives at home alone.   Right-handed.   4 cups caffeine per day.        PHYSICAL EXAM   Vitals:   02/23/17 1213  BP: 137/86  Pulse: (!) 109  Weight: 213 lb (96.6 kg)  Height: 5' 11.5" (1.816 m)    Not recorded      Body mass index is 29.29 kg/m.  PHYSICAL EXAMNIATION:  Gen: NAD, conversant, well nourised, obese, well groomed                     Cardiovascular: Regular rate rhythm, no peripheral edema, warm, nontender. Eyes: Conjunctivae clear without exudates or hemorrhage Neck: Supple, no carotid bruits. Pulmonary: Clear to auscultation bilaterally   NEUROLOGICAL EXAM:  MENTAL STATUS: Speech:    Speech is normal; fluent and spontaneous with normal comprehension.    Cognition:     Orientation to time, place and person     Normal recent and remote memory     Normal Attention span and concentration     Normal Language, naming, repeating,spontaneous speech     Fund of knowledge   CRANIAL NERVES: CN II: Visual fields are full to confrontation. Fundoscopic exam is normal with sharp discs and no vascular changes. Pupils are round equal and briskly reactive to light. CN III, IV, VI: extraocular  movement are normal. No ptosis. CN V: Facial sensation is intact to pinprick in all 3 divisions bilaterally. Corneal responses are intact.  CN VII: Face is symmetric with normal eye closure and smile. CN VIII: Hearing is normal to rubbing fingers CN IX, X: Palate elevates symmetrically. Phonation is normal. CN XI: Head turning and shoulder shrug are intact CN XII: Tongue is midline with normal movements and no atrophy.  MOTOR: There is no pronator drift of out-stretched arms. Muscle bulk and tone are normal. Muscle strength is normal.  REFLEXES: Reflexes are 2+ and symmetric at the biceps, triceps, knees, and ankles. Plantar responses are flexor.  SENSORY: Intact to light touch, pinprick, positional sensation and vibratory sensation are intact in fingers and toes.  COORDINATION: Rapid alternating movements and fine finger movements are intact. There is no dysmetria on finger-to-nose and heel-knee-shin.    GAIT/STANCE: Posture is normal. Gait is steady with normal steps, base, arm swing, and turning. Heel and toe walking are normal. Tandem gait is normal.  Romberg is absent.   DIAGNOSTIC DATA (LABS, IMAGING, TESTING) - I reviewed patient records, labs, notes, testing and imaging myself where available.   ASSESSMENT AND PLAN  Quinesha Selinger is a 31 y.o. female   Chronic migraine with aura  I have giving her prescription of Topamax may titrating to 50 mg 2 tablets every night as preventative medications  Maxalt 10 mg dissolvable as needed for abortive  treatment  Continue follow-up with her primary care physician   Michelle Braun, M.D. Ph.D.  [facility] [address] [place], [place] [address] Ph: [phone] Fax: [phone]  CC: Michelle Coy, MD

## 2017-02-27 MED FILL — OXYCODONE-APAP 10-325 TAB: 10-325 | 30 days supply | Qty: 90 | Fill #0

## 2017-02-27 MED FILL — MORPHINE SULF ER 15 MG TAB: 15 | 30 days supply | Qty: 60 | Fill #0

## 2017-03-07 MED FILL — GABAPENTIN 300 MG CAPSULE: 300 | 30 days supply | Qty: 90 | Fill #0

## 2017-03-16 ENCOUNTER — Ambulatory Visit
Admission: RE | Admit: 2017-03-16 | Discharge: 2017-03-16 | Disposition: A | Discharge status: Home / Self Care | Payer: BLUE CROSS/BLUE SHIELD | Source: Ambulatory Visit | Attending: Family Medicine | Admitting: Family Medicine

## 2017-03-16 ENCOUNTER — Other Ambulatory Visit: Payer: Self-pay | Admitting: Family Medicine

## 2017-03-16 DIAGNOSIS — M533 Sacrococcygeal disorders, not elsewhere classified: Secondary | ICD-10-CM | POA: Diagnosis not present

## 2017-03-16 DIAGNOSIS — M47816 Spondylosis without myelopathy or radiculopathy, lumbar region: Secondary | ICD-10-CM | POA: Diagnosis not present

## 2017-03-16 DIAGNOSIS — M545 Low back pain: Secondary | ICD-10-CM

## 2017-03-21 DIAGNOSIS — G479 Sleep disorder, unspecified: Secondary | ICD-10-CM | POA: Diagnosis not present

## 2017-03-27 ENCOUNTER — Other Ambulatory Visit: Payer: Self-pay | Admitting: Family Medicine

## 2017-03-27 DIAGNOSIS — G894 Chronic pain syndrome: Secondary | ICD-10-CM | POA: Diagnosis not present

## 2017-03-27 DIAGNOSIS — M533 Sacrococcygeal disorders, not elsewhere classified: Secondary | ICD-10-CM | POA: Diagnosis not present

## 2017-03-27 DIAGNOSIS — Z5181 Encounter for therapeutic drug level monitoring: Secondary | ICD-10-CM | POA: Diagnosis not present

## 2017-03-27 DIAGNOSIS — Z79899 Other long term (current) drug therapy: Secondary | ICD-10-CM | POA: Diagnosis not present

## 2017-03-28 ENCOUNTER — Other Ambulatory Visit: Payer: BLUE CROSS/BLUE SHIELD

## 2017-03-29 MED FILL — MORPHINE SULF ER 15 MG TAB: 15 | 30 days supply | Qty: 90 | Fill #0

## 2017-03-29 MED FILL — GABAPENTIN 300 MG CAPSULE: 300 | 30 days supply | Qty: 120 | Fill #0

## 2017-03-29 MED FILL — OXYCODONE-APAP 10-325 TAB: 10-325 | 30 days supply | Qty: 90 | Fill #0

## 2017-03-30 ENCOUNTER — Ambulatory Visit
Admission: RE | Admit: 2017-03-30 | Discharge: 2017-03-30 | Disposition: A | Discharge status: Home / Self Care | Payer: BLUE CROSS/BLUE SHIELD | Source: Ambulatory Visit | Attending: Family Medicine | Admitting: Family Medicine

## 2017-03-30 DIAGNOSIS — M533 Sacrococcygeal disorders, not elsewhere classified: Secondary | ICD-10-CM | POA: Diagnosis not present

## 2017-04-27 DIAGNOSIS — G894 Chronic pain syndrome: Secondary | ICD-10-CM | POA: Diagnosis not present

## 2017-04-27 DIAGNOSIS — Z79899 Other long term (current) drug therapy: Secondary | ICD-10-CM | POA: Diagnosis not present

## 2017-04-27 DIAGNOSIS — M533 Sacrococcygeal disorders, not elsewhere classified: Secondary | ICD-10-CM | POA: Diagnosis not present

## 2017-04-27 DIAGNOSIS — Z5181 Encounter for therapeutic drug level monitoring: Secondary | ICD-10-CM | POA: Diagnosis not present

## 2017-04-27 MED FILL — MORPHINE SULF ER 30 MG TAB: 30 | 30 days supply | Qty: 60 | Fill #0

## 2017-04-27 MED FILL — GABAPENTIN 300 MG CAPSULE: 300 | 30 days supply | Qty: 120 | Fill #1

## 2017-04-27 MED FILL — OXYCODONE-APAP 10-325 TAB: 10-325 | 30 days supply | Qty: 90 | Fill #0

## 2017-05-03 IMAGING — CR DG LUMBAR SPINE COMPLETE 4+V
5 series · 5 of 5 positions shown · non-contrast
Comparison: 07/30/2014 CT

CLINICAL DATA: Left-sided low back pain over the last 2 weeks.

EXAM:
LUMBAR SPINE - COMPLETE 4+ VIEW

[t l-spine a.p.]
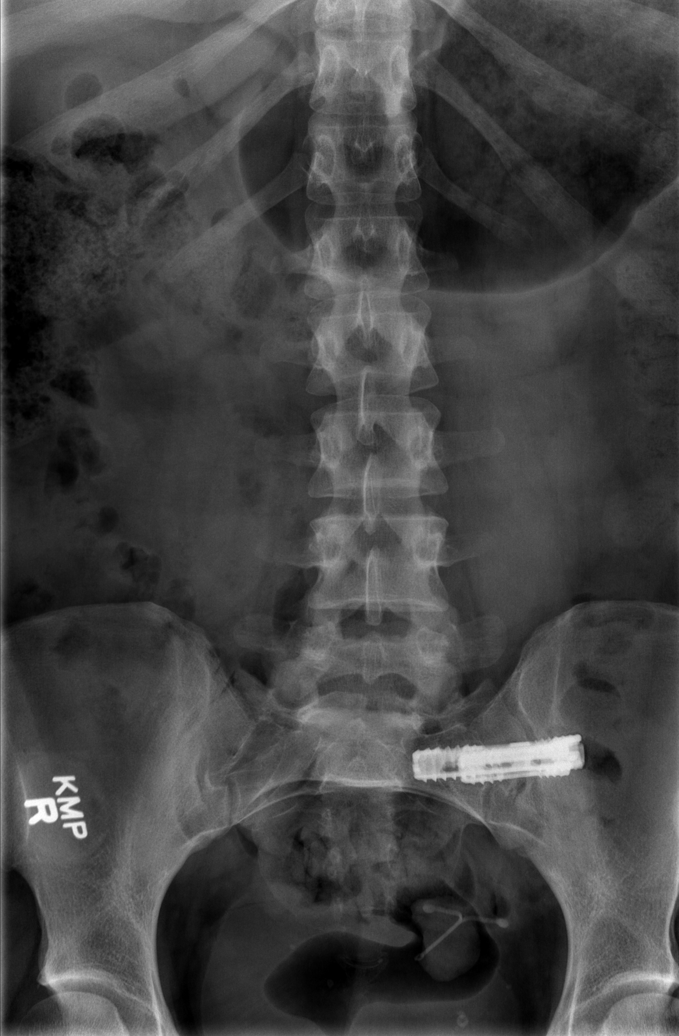

[t l-spine oblique exposure (1 of 2)]
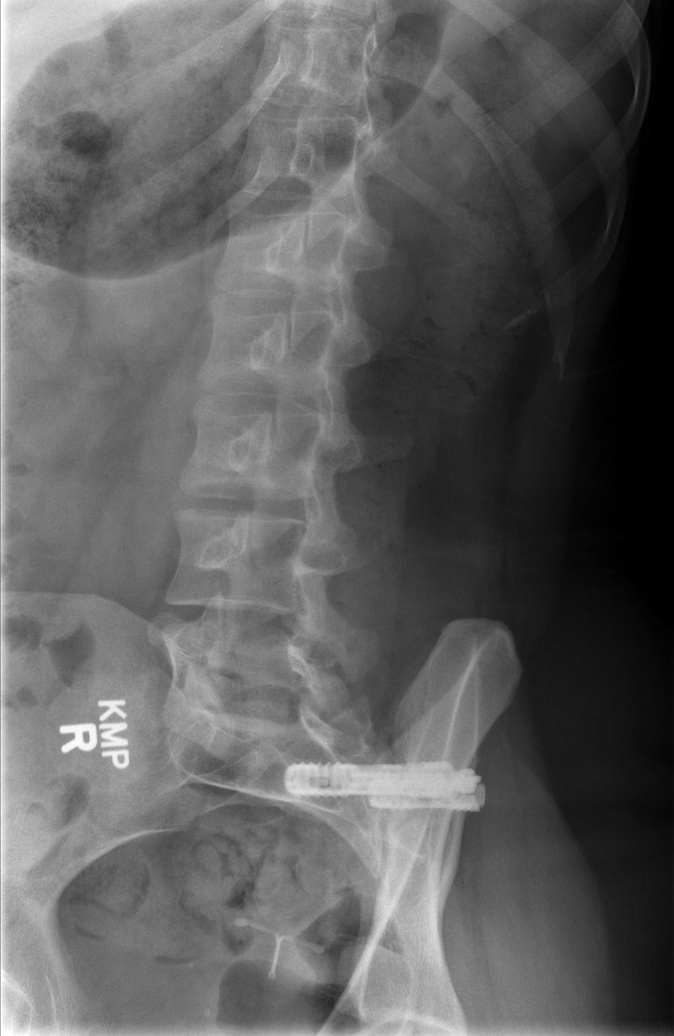

[t l-spine oblique exposure (2 of 2)]
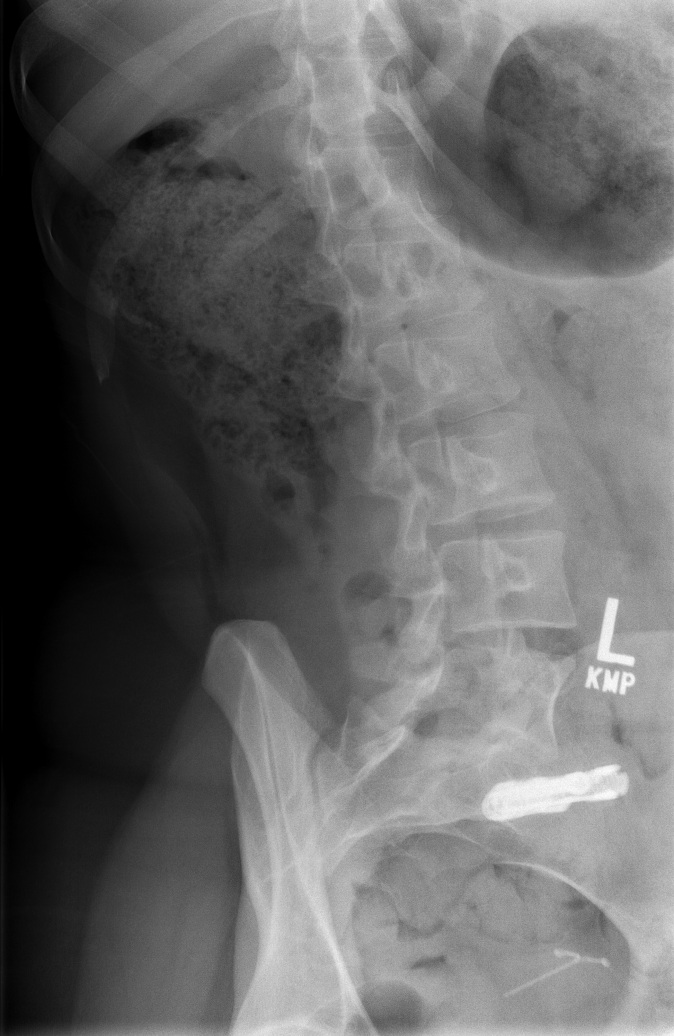

[t l-spine lat]
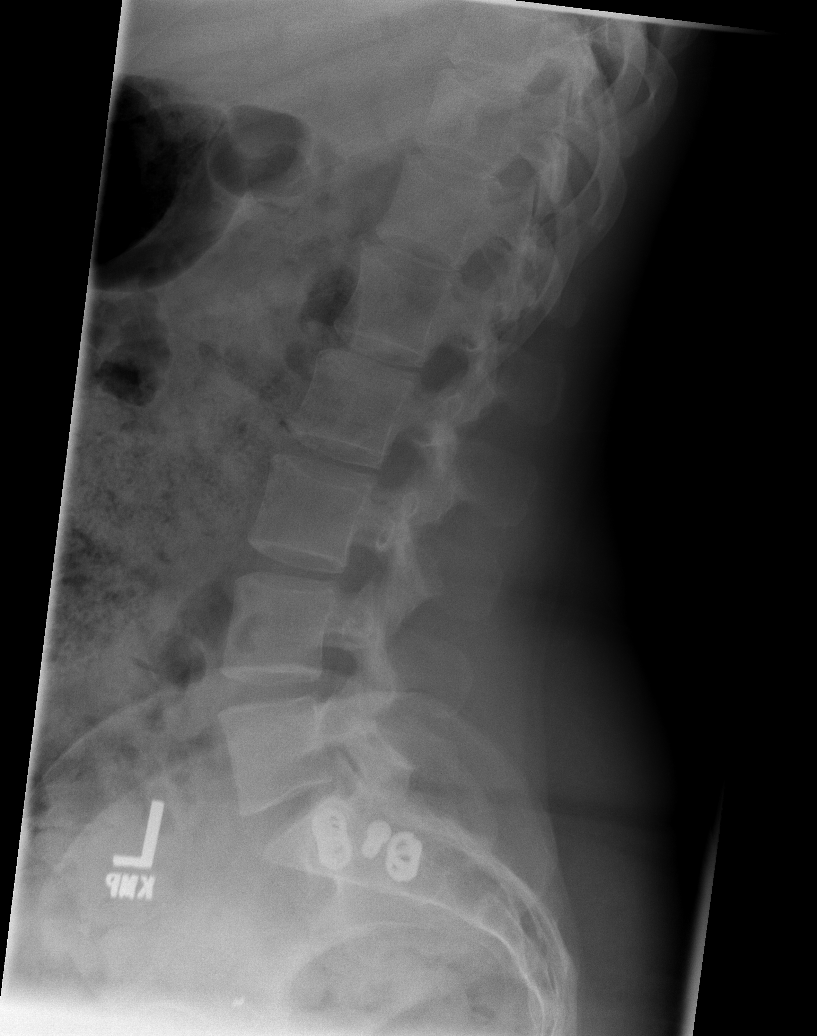

[t l-spine l5-s1 spot]
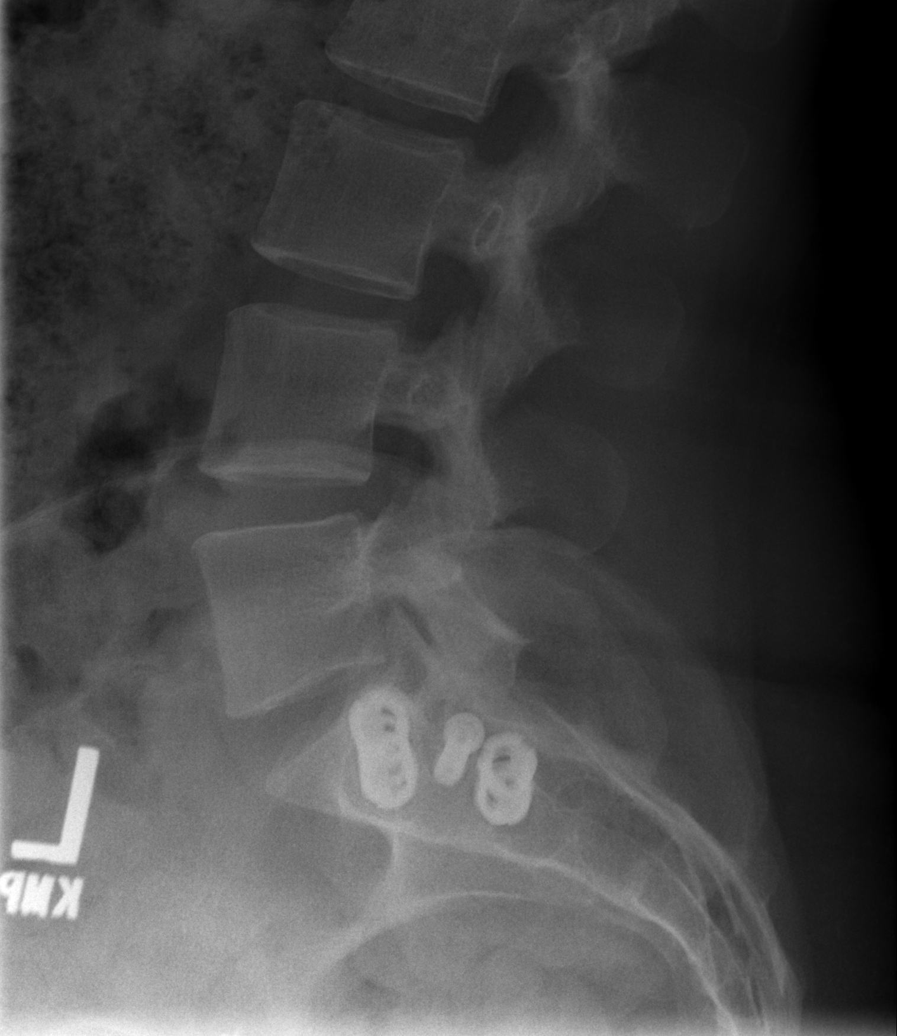

[5 of 5 positions shown; findings below may reference images not displayed]

FINDINGS: Minimal thoracolumbar curvature. No disc space narrowing. Mild facet
osteoarthritis L5-S1. Previous sacroiliac arthrodesis procedure.
Question some lucency around the most anterior component suggesting
that there could be ongoing motion. Mild arthritic changes of the
right sacroiliac joint also noted. IUD noted in the pelvis.
Incidental bone island T11.
IMPRESSION: Previous left sacroiliac fusion. Question some lucency around the
anterior device suggesting that there could be ongoing motion.

Some facet osteoarthritis L5-S1. Mild degenerative changes of the
right sacroiliac joint.

## 2017-05-11 DIAGNOSIS — M545 Low back pain: Secondary | ICD-10-CM | POA: Diagnosis not present

## 2017-05-22 DIAGNOSIS — G894 Chronic pain syndrome: Secondary | ICD-10-CM | POA: Diagnosis not present

## 2017-05-22 DIAGNOSIS — M533 Sacrococcygeal disorders, not elsewhere classified: Secondary | ICD-10-CM | POA: Diagnosis not present

## 2017-05-24 DIAGNOSIS — M5126 Other intervertebral disc displacement, lumbar region: Secondary | ICD-10-CM | POA: Diagnosis not present

## 2017-05-24 DIAGNOSIS — M47816 Spondylosis without myelopathy or radiculopathy, lumbar region: Secondary | ICD-10-CM | POA: Diagnosis not present

## 2017-05-24 DIAGNOSIS — M5416 Radiculopathy, lumbar region: Secondary | ICD-10-CM | POA: Diagnosis not present

## 2017-05-24 DIAGNOSIS — M4807 Spinal stenosis, lumbosacral region: Secondary | ICD-10-CM | POA: Diagnosis not present

## 2017-05-25 DIAGNOSIS — M25852 Other specified joint disorders, left hip: Secondary | ICD-10-CM | POA: Diagnosis not present

## 2017-05-25 DIAGNOSIS — M25552 Pain in left hip: Secondary | ICD-10-CM | POA: Diagnosis not present

## 2017-05-31 DIAGNOSIS — M533 Sacrococcygeal disorders, not elsewhere classified: Secondary | ICD-10-CM | POA: Diagnosis not present

## 2017-05-31 DIAGNOSIS — M461 Sacroiliitis, not elsewhere classified: Secondary | ICD-10-CM | POA: Diagnosis not present

## 2017-06-09 DIAGNOSIS — M461 Sacroiliitis, not elsewhere classified: Secondary | ICD-10-CM | POA: Diagnosis not present

## 2017-06-12 DIAGNOSIS — F331 Major depressive disorder, recurrent, moderate: Secondary | ICD-10-CM | POA: Diagnosis not present

## 2017-06-12 DIAGNOSIS — F902 Attention-deficit hyperactivity disorder, combined type: Secondary | ICD-10-CM | POA: Diagnosis not present

## 2017-06-22 DIAGNOSIS — M533 Sacrococcygeal disorders, not elsewhere classified: Secondary | ICD-10-CM | POA: Diagnosis not present

## 2017-06-22 DIAGNOSIS — G894 Chronic pain syndrome: Secondary | ICD-10-CM | POA: Diagnosis not present

## 2017-06-22 DIAGNOSIS — M5416 Radiculopathy, lumbar region: Secondary | ICD-10-CM | POA: Diagnosis not present

## 2017-06-22 DIAGNOSIS — M47816 Spondylosis without myelopathy or radiculopathy, lumbar region: Secondary | ICD-10-CM | POA: Diagnosis not present

## 2017-06-26 MED FILL — OXYCODONE-APAP 10-325 TAB: 10-325 | 30 days supply | Qty: 90 | Fill #0

## 2017-06-26 MED FILL — BELBUCA 600 MCG FILM: 600 | 30 days supply | Qty: 60 | Fill #0

## 2017-07-24 DIAGNOSIS — G894 Chronic pain syndrome: Secondary | ICD-10-CM | POA: Diagnosis not present

## 2017-07-24 DIAGNOSIS — Z79899 Other long term (current) drug therapy: Secondary | ICD-10-CM | POA: Diagnosis not present

## 2017-07-24 DIAGNOSIS — M533 Sacrococcygeal disorders, not elsewhere classified: Secondary | ICD-10-CM | POA: Diagnosis not present

## 2017-07-24 DIAGNOSIS — Z5181 Encounter for therapeutic drug level monitoring: Secondary | ICD-10-CM | POA: Diagnosis not present

## 2017-07-24 DIAGNOSIS — M47816 Spondylosis without myelopathy or radiculopathy, lumbar region: Secondary | ICD-10-CM | POA: Diagnosis not present

## 2017-07-24 MED FILL — MORPHINE SULF 30 MG TAB SA: 30 | 30 days supply | Qty: 60 | Fill #0

## 2017-07-24 MED FILL — OXYCODONE-APAP 10-325 TAB: 10-325 | 30 days supply | Qty: 90 | Fill #0

## 2017-07-28 ENCOUNTER — Other Ambulatory Visit (HOSPITAL_COMMUNITY): Payer: Self-pay | Admitting: Physician Assistant

## 2017-07-28 ENCOUNTER — Other Ambulatory Visit: Payer: Self-pay | Admitting: Physician Assistant

## 2017-07-28 DIAGNOSIS — K3184 Gastroparesis: Secondary | ICD-10-CM | POA: Diagnosis not present

## 2017-07-28 DIAGNOSIS — R1115 Cyclical vomiting syndrome unrelated to migraine: Secondary | ICD-10-CM

## 2017-07-28 DIAGNOSIS — G43A1 Cyclical vomiting, intractable: Secondary | ICD-10-CM | POA: Diagnosis not present

## 2017-07-28 DIAGNOSIS — R1012 Left upper quadrant pain: Secondary | ICD-10-CM | POA: Diagnosis not present

## 2017-08-01 ENCOUNTER — Other Ambulatory Visit: Payer: BLUE CROSS/BLUE SHIELD

## 2017-08-03 ENCOUNTER — Ambulatory Visit (HOSPITAL_COMMUNITY)
Admission: RE | Admit: 2017-08-03 | Discharge: 2017-08-03 | Disposition: A | Discharge status: Home / Self Care | Payer: BLUE CROSS/BLUE SHIELD | Source: Ambulatory Visit | Attending: Physician Assistant | Admitting: Physician Assistant

## 2017-08-03 ENCOUNTER — Encounter (HOSPITAL_COMMUNITY): Payer: Self-pay

## 2017-08-24 DIAGNOSIS — M5416 Radiculopathy, lumbar region: Secondary | ICD-10-CM | POA: Diagnosis not present

## 2017-08-24 DIAGNOSIS — Z5181 Encounter for therapeutic drug level monitoring: Secondary | ICD-10-CM | POA: Diagnosis not present

## 2017-08-24 DIAGNOSIS — Z79899 Other long term (current) drug therapy: Secondary | ICD-10-CM | POA: Diagnosis not present

## 2017-08-24 DIAGNOSIS — G894 Chronic pain syndrome: Secondary | ICD-10-CM | POA: Diagnosis not present

## 2017-08-24 DIAGNOSIS — M533 Sacrococcygeal disorders, not elsewhere classified: Secondary | ICD-10-CM | POA: Diagnosis not present

## 2017-08-24 DIAGNOSIS — M47816 Spondylosis without myelopathy or radiculopathy, lumbar region: Secondary | ICD-10-CM | POA: Diagnosis not present

## 2017-08-24 MED FILL — MORPHINE SULF ER 30 MG TAB: 30 | 30 days supply | Qty: 60 | Fill #0

## 2017-08-24 MED FILL — OXYCODONE-APAP 10-325 TAB: 10-325 | 30 days supply | Qty: 90 | Fill #0

## 2017-09-26 DIAGNOSIS — G894 Chronic pain syndrome: Secondary | ICD-10-CM | POA: Diagnosis not present

## 2017-09-26 DIAGNOSIS — M533 Sacrococcygeal disorders, not elsewhere classified: Secondary | ICD-10-CM | POA: Diagnosis not present

## 2017-09-26 DIAGNOSIS — Z79899 Other long term (current) drug therapy: Secondary | ICD-10-CM | POA: Diagnosis not present

## 2017-09-26 DIAGNOSIS — M5416 Radiculopathy, lumbar region: Secondary | ICD-10-CM | POA: Diagnosis not present

## 2017-09-26 DIAGNOSIS — G8929 Other chronic pain: Secondary | ICD-10-CM | POA: Diagnosis not present

## 2017-09-26 DIAGNOSIS — Z5181 Encounter for therapeutic drug level monitoring: Secondary | ICD-10-CM | POA: Diagnosis not present

## 2017-10-12 DIAGNOSIS — M5416 Radiculopathy, lumbar region: Secondary | ICD-10-CM | POA: Diagnosis not present

## 2017-10-12 DIAGNOSIS — Z5181 Encounter for therapeutic drug level monitoring: Secondary | ICD-10-CM | POA: Diagnosis not present

## 2017-10-12 DIAGNOSIS — G8929 Other chronic pain: Secondary | ICD-10-CM | POA: Diagnosis not present

## 2017-10-12 DIAGNOSIS — Z79899 Other long term (current) drug therapy: Secondary | ICD-10-CM | POA: Diagnosis not present

## 2017-10-12 DIAGNOSIS — M533 Sacrococcygeal disorders, not elsewhere classified: Secondary | ICD-10-CM | POA: Diagnosis not present

## 2017-10-12 DIAGNOSIS — G894 Chronic pain syndrome: Secondary | ICD-10-CM | POA: Diagnosis not present

## 2017-10-12 MED FILL — BUPRENORPHINE 10 MCG/HR PAT: 10 | 28 days supply | Qty: 4 | Fill #0

## 2017-10-24 DIAGNOSIS — G894 Chronic pain syndrome: Secondary | ICD-10-CM | POA: Diagnosis not present

## 2017-10-24 DIAGNOSIS — M533 Sacrococcygeal disorders, not elsewhere classified: Secondary | ICD-10-CM | POA: Diagnosis not present

## 2017-10-24 DIAGNOSIS — M47816 Spondylosis without myelopathy or radiculopathy, lumbar region: Secondary | ICD-10-CM | POA: Diagnosis not present

## 2017-10-24 DIAGNOSIS — M5416 Radiculopathy, lumbar region: Secondary | ICD-10-CM | POA: Diagnosis not present

## 2017-10-24 MED FILL — fentaNYL 25 MCG/HR PT72: 25 | 30 days supply | Qty: 10 | Fill #0

## 2017-11-23 DIAGNOSIS — M5416 Radiculopathy, lumbar region: Secondary | ICD-10-CM | POA: Diagnosis not present

## 2017-11-23 DIAGNOSIS — M4807 Spinal stenosis, lumbosacral region: Secondary | ICD-10-CM | POA: Diagnosis not present

## 2017-11-23 DIAGNOSIS — M545 Low back pain: Secondary | ICD-10-CM | POA: Diagnosis not present

## 2017-11-23 DIAGNOSIS — M47816 Spondylosis without myelopathy or radiculopathy, lumbar region: Secondary | ICD-10-CM | POA: Diagnosis not present

## 2017-11-30 DIAGNOSIS — F902 Attention-deficit hyperactivity disorder, combined type: Secondary | ICD-10-CM | POA: Diagnosis not present

## 2017-11-30 DIAGNOSIS — F331 Major depressive disorder, recurrent, moderate: Secondary | ICD-10-CM | POA: Diagnosis not present

## 2017-12-01 DIAGNOSIS — M47818 Spondylosis without myelopathy or radiculopathy, sacral and sacrococcygeal region: Secondary | ICD-10-CM | POA: Diagnosis not present

## 2017-12-01 DIAGNOSIS — M461 Sacroiliitis, not elsewhere classified: Secondary | ICD-10-CM | POA: Diagnosis not present

## 2017-12-01 DIAGNOSIS — M533 Sacrococcygeal disorders, not elsewhere classified: Secondary | ICD-10-CM | POA: Diagnosis not present

## 2017-12-11 DIAGNOSIS — F331 Major depressive disorder, recurrent, moderate: Secondary | ICD-10-CM | POA: Diagnosis not present

## 2017-12-11 DIAGNOSIS — F902 Attention-deficit hyperactivity disorder, combined type: Secondary | ICD-10-CM | POA: Diagnosis not present

## 2017-12-12 DIAGNOSIS — M4807 Spinal stenosis, lumbosacral region: Secondary | ICD-10-CM | POA: Diagnosis not present

## 2017-12-12 DIAGNOSIS — M5126 Other intervertebral disc displacement, lumbar region: Secondary | ICD-10-CM | POA: Diagnosis not present

## 2017-12-12 DIAGNOSIS — M5416 Radiculopathy, lumbar region: Secondary | ICD-10-CM | POA: Diagnosis not present

## 2018-01-22 DIAGNOSIS — M47816 Spondylosis without myelopathy or radiculopathy, lumbar region: Secondary | ICD-10-CM | POA: Diagnosis not present

## 2018-01-22 DIAGNOSIS — M4807 Spinal stenosis, lumbosacral region: Secondary | ICD-10-CM | POA: Diagnosis not present

## 2018-01-22 DIAGNOSIS — M5416 Radiculopathy, lumbar region: Secondary | ICD-10-CM | POA: Diagnosis not present

## 2018-01-22 DIAGNOSIS — M5126 Other intervertebral disc displacement, lumbar region: Secondary | ICD-10-CM | POA: Diagnosis not present

## 2018-01-23 DIAGNOSIS — M461 Sacroiliitis, not elsewhere classified: Secondary | ICD-10-CM | POA: Diagnosis not present

## 2018-01-23 DIAGNOSIS — M545 Low back pain: Secondary | ICD-10-CM | POA: Diagnosis not present

## 2018-02-01 ENCOUNTER — Other Ambulatory Visit: Payer: Self-pay | Admitting: Orthopedic Surgery

## 2018-02-01 DIAGNOSIS — M545 Low back pain: Secondary | ICD-10-CM

## 2018-02-06 DIAGNOSIS — M461 Sacroiliitis, not elsewhere classified: Secondary | ICD-10-CM | POA: Diagnosis not present

## 2018-02-06 DIAGNOSIS — F902 Attention-deficit hyperactivity disorder, combined type: Secondary | ICD-10-CM | POA: Diagnosis not present

## 2018-02-06 DIAGNOSIS — M5127 Other intervertebral disc displacement, lumbosacral region: Secondary | ICD-10-CM | POA: Diagnosis not present

## 2018-02-06 DIAGNOSIS — F331 Major depressive disorder, recurrent, moderate: Secondary | ICD-10-CM | POA: Diagnosis not present

## 2018-02-07 DIAGNOSIS — M4807 Spinal stenosis, lumbosacral region: Secondary | ICD-10-CM | POA: Diagnosis not present

## 2018-02-07 DIAGNOSIS — M5416 Radiculopathy, lumbar region: Secondary | ICD-10-CM | POA: Diagnosis not present

## 2018-02-07 DIAGNOSIS — M47816 Spondylosis without myelopathy or radiculopathy, lumbar region: Secondary | ICD-10-CM | POA: Diagnosis not present

## 2018-02-07 DIAGNOSIS — M5126 Other intervertebral disc displacement, lumbar region: Secondary | ICD-10-CM | POA: Diagnosis not present

## 2018-02-13 DIAGNOSIS — M461 Sacroiliitis, not elsewhere classified: Secondary | ICD-10-CM | POA: Diagnosis not present

## 2018-02-14 DIAGNOSIS — M461 Sacroiliitis, not elsewhere classified: Secondary | ICD-10-CM | POA: Diagnosis not present

## 2018-02-16 DIAGNOSIS — M25552 Pain in left hip: Secondary | ICD-10-CM | POA: Diagnosis not present

## 2018-04-05 ENCOUNTER — Encounter: Payer: Self-pay | Admitting: *Deleted

## 2018-04-06 DIAGNOSIS — H1033 Unspecified acute conjunctivitis, bilateral: Secondary | ICD-10-CM | POA: Diagnosis not present

## 2018-05-14 DIAGNOSIS — F321 Major depressive disorder, single episode, moderate: Secondary | ICD-10-CM | POA: Diagnosis not present

## 2018-05-17 ENCOUNTER — Telehealth: Payer: Self-pay | Admitting: Physical Medicine & Rehabilitation

## 2018-05-17 NOTE — Telephone Encounter (Signed)
Phoned patient and advised that injections are not indicated based on past procedures we do not rX - [facility]

## 2018-05-21 DIAGNOSIS — F321 Major depressive disorder, single episode, moderate: Secondary | ICD-10-CM | POA: Diagnosis not present

## 2018-05-31 DIAGNOSIS — F331 Major depressive disorder, recurrent, moderate: Secondary | ICD-10-CM | POA: Diagnosis not present

## 2018-05-31 DIAGNOSIS — F902 Attention-deficit hyperactivity disorder, combined type: Secondary | ICD-10-CM | POA: Diagnosis not present

## 2018-06-11 DIAGNOSIS — F321 Major depressive disorder, single episode, moderate: Secondary | ICD-10-CM | POA: Diagnosis not present

## 2018-06-13 DIAGNOSIS — M533 Sacrococcygeal disorders, not elsewhere classified: Secondary | ICD-10-CM | POA: Diagnosis not present

## 2018-06-18 DIAGNOSIS — F321 Major depressive disorder, single episode, moderate: Secondary | ICD-10-CM | POA: Diagnosis not present

## 2018-06-21 DIAGNOSIS — M533 Sacrococcygeal disorders, not elsewhere classified: Secondary | ICD-10-CM | POA: Diagnosis not present

## 2018-07-02 DIAGNOSIS — F321 Major depressive disorder, single episode, moderate: Secondary | ICD-10-CM | POA: Diagnosis not present

## 2018-07-09 DIAGNOSIS — F321 Major depressive disorder, single episode, moderate: Secondary | ICD-10-CM | POA: Diagnosis not present

## 2018-07-23 DIAGNOSIS — M47816 Spondylosis without myelopathy or radiculopathy, lumbar region: Secondary | ICD-10-CM | POA: Diagnosis not present

## 2018-07-23 DIAGNOSIS — G894 Chronic pain syndrome: Secondary | ICD-10-CM | POA: Diagnosis not present

## 2018-07-23 DIAGNOSIS — M533 Sacrococcygeal disorders, not elsewhere classified: Secondary | ICD-10-CM | POA: Diagnosis not present

## 2018-07-26 DIAGNOSIS — F321 Major depressive disorder, single episode, moderate: Secondary | ICD-10-CM | POA: Diagnosis not present

## 2018-08-27 DIAGNOSIS — F321 Major depressive disorder, single episode, moderate: Secondary | ICD-10-CM | POA: Diagnosis not present

## 2018-09-20 DIAGNOSIS — R350 Frequency of micturition: Secondary | ICD-10-CM | POA: Diagnosis not present

## 2018-09-20 DIAGNOSIS — Z23 Encounter for immunization: Secondary | ICD-10-CM | POA: Diagnosis not present

## 2018-09-20 DIAGNOSIS — F902 Attention-deficit hyperactivity disorder, combined type: Secondary | ICD-10-CM | POA: Diagnosis not present

## 2018-09-20 DIAGNOSIS — F331 Major depressive disorder, recurrent, moderate: Secondary | ICD-10-CM | POA: Diagnosis not present

## 2018-09-24 DIAGNOSIS — F321 Major depressive disorder, single episode, moderate: Secondary | ICD-10-CM | POA: Diagnosis not present

## 2019-01-03 DIAGNOSIS — F902 Attention-deficit hyperactivity disorder, combined type: Secondary | ICD-10-CM | POA: Diagnosis not present

## 2019-01-03 DIAGNOSIS — F331 Major depressive disorder, recurrent, moderate: Secondary | ICD-10-CM | POA: Diagnosis not present

## 2019-01-18 DIAGNOSIS — G8929 Other chronic pain: Secondary | ICD-10-CM | POA: Diagnosis not present

## 2019-01-18 DIAGNOSIS — M533 Sacrococcygeal disorders, not elsewhere classified: Secondary | ICD-10-CM | POA: Diagnosis not present

## 2019-01-18 DIAGNOSIS — Z886 Allergy status to analgesic agent status: Secondary | ICD-10-CM | POA: Diagnosis not present

## 2019-01-18 DIAGNOSIS — M7072 Other bursitis of hip, left hip: Secondary | ICD-10-CM | POA: Diagnosis not present

## 2019-01-18 DIAGNOSIS — M7918 Myalgia, other site: Secondary | ICD-10-CM | POA: Diagnosis not present

## 2019-01-31 DIAGNOSIS — G8929 Other chronic pain: Secondary | ICD-10-CM | POA: Diagnosis not present

## 2019-01-31 DIAGNOSIS — M7918 Myalgia, other site: Secondary | ICD-10-CM | POA: Diagnosis not present

## 2019-02-07 DIAGNOSIS — M7918 Myalgia, other site: Secondary | ICD-10-CM | POA: Diagnosis not present

## 2019-02-07 DIAGNOSIS — G8929 Other chronic pain: Secondary | ICD-10-CM | POA: Diagnosis not present

## 2019-05-10 DIAGNOSIS — N938 Other specified abnormal uterine and vaginal bleeding: Secondary | ICD-10-CM | POA: Diagnosis not present

## 2019-06-03 DIAGNOSIS — Z30433 Encounter for removal and reinsertion of intrauterine contraceptive device: Secondary | ICD-10-CM | POA: Diagnosis not present

## 2019-06-24 DIAGNOSIS — F331 Major depressive disorder, recurrent, moderate: Secondary | ICD-10-CM | POA: Diagnosis not present

## 2019-06-24 DIAGNOSIS — F902 Attention-deficit hyperactivity disorder, combined type: Secondary | ICD-10-CM | POA: Diagnosis not present

## 2019-07-17 DIAGNOSIS — F902 Attention-deficit hyperactivity disorder, combined type: Secondary | ICD-10-CM | POA: Diagnosis not present

## 2019-07-17 DIAGNOSIS — F331 Major depressive disorder, recurrent, moderate: Secondary | ICD-10-CM | POA: Diagnosis not present

## 2019-07-22 DIAGNOSIS — F902 Attention-deficit hyperactivity disorder, combined type: Secondary | ICD-10-CM | POA: Diagnosis not present

## 2019-07-22 DIAGNOSIS — F331 Major depressive disorder, recurrent, moderate: Secondary | ICD-10-CM | POA: Diagnosis not present

## 2019-09-27 DIAGNOSIS — G43A1 Cyclical vomiting, intractable: Secondary | ICD-10-CM | POA: Diagnosis not present

## 2019-09-27 DIAGNOSIS — R1012 Left upper quadrant pain: Secondary | ICD-10-CM | POA: Diagnosis not present

## 2019-09-27 DIAGNOSIS — K3184 Gastroparesis: Secondary | ICD-10-CM | POA: Diagnosis not present

## 2019-10-22 DIAGNOSIS — F902 Attention-deficit hyperactivity disorder, combined type: Secondary | ICD-10-CM | POA: Diagnosis not present

## 2019-10-22 DIAGNOSIS — R1012 Left upper quadrant pain: Secondary | ICD-10-CM | POA: Diagnosis not present

## 2019-10-22 DIAGNOSIS — F331 Major depressive disorder, recurrent, moderate: Secondary | ICD-10-CM | POA: Diagnosis not present

## 2019-10-24 ENCOUNTER — Other Ambulatory Visit: Payer: Self-pay | Admitting: Physician Assistant

## 2019-10-24 DIAGNOSIS — R7989 Other specified abnormal findings of blood chemistry: Secondary | ICD-10-CM

## 2019-10-24 DIAGNOSIS — K3184 Gastroparesis: Secondary | ICD-10-CM | POA: Diagnosis not present

## 2019-10-24 DIAGNOSIS — R1012 Left upper quadrant pain: Secondary | ICD-10-CM

## 2019-10-24 DIAGNOSIS — R748 Abnormal levels of other serum enzymes: Secondary | ICD-10-CM | POA: Diagnosis not present

## 2020-02-05 ENCOUNTER — Encounter (HOSPITAL_COMMUNITY): Payer: Self-pay | Admitting: Emergency Medicine

## 2020-02-05 ENCOUNTER — Other Ambulatory Visit: Payer: Self-pay

## 2020-02-05 ENCOUNTER — Emergency Department (HOSPITAL_COMMUNITY)
Admission: EM | Admit: 2020-02-05 | Discharge: 2020-02-05 | Disposition: A | Discharge status: Home / Self Care | Payer: BLUE CROSS/BLUE SHIELD | Attending: Emergency Medicine | Admitting: Emergency Medicine

## 2020-02-05 ENCOUNTER — Emergency Department (HOSPITAL_COMMUNITY): Payer: BLUE CROSS/BLUE SHIELD

## 2020-02-05 DIAGNOSIS — Z20822 Contact with and (suspected) exposure to covid-19: Secondary | ICD-10-CM | POA: Diagnosis not present

## 2020-02-05 DIAGNOSIS — R05 Cough: Secondary | ICD-10-CM | POA: Insufficient documentation

## 2020-02-05 DIAGNOSIS — Z79899 Other long term (current) drug therapy: Secondary | ICD-10-CM | POA: Insufficient documentation

## 2020-02-05 DIAGNOSIS — R0602 Shortness of breath: Secondary | ICD-10-CM | POA: Insufficient documentation

## 2020-02-05 DIAGNOSIS — Z8709 Personal history of other diseases of the respiratory system: Secondary | ICD-10-CM | POA: Insufficient documentation

## 2020-02-05 LAB — CBC WITH DIFFERENTIAL/PLATELET
Abs Immature Granulocytes: 0.03 10*3/uL (ref 0.00–0.07)
Basophils Absolute: 0.1 10*3/uL (ref 0.0–0.1)
Basophils Relative: 1 %
Eosinophils Absolute: 0 10*3/uL (ref 0.0–0.5)
Eosinophils Relative: 0 %
HCT: 42.1 % (ref 36.0–46.0)
Hemoglobin: 14 g/dL (ref 12.0–15.0)
Immature Granulocytes: 1 %
Lymphocytes Relative: 16 %
Lymphs Abs: 1.1 10*3/uL (ref 0.7–4.0)
MCH: 30.5 pg (ref 26.0–34.0)
MCHC: 33.3 g/dL (ref 30.0–36.0)
MCV: 91.7 fL (ref 80.0–100.0)
Monocytes Absolute: 0.5 10*3/uL (ref 0.1–1.0)
Monocytes Relative: 7 %
Neutro Abs: 5 10*3/uL (ref 1.7–7.7)
Neutrophils Relative %: 75 %
Platelets: 252 10*3/uL (ref 150–400)
RBC: 4.59 MIL/uL (ref 3.87–5.11)
RDW: 14 % (ref 11.5–15.5)
WBC: 6.7 10*3/uL (ref 4.0–10.5)
nRBC: 0 % (ref 0.0–0.2)

## 2020-02-05 LAB — BASIC METABOLIC PANEL
Anion gap: 22 — ABNORMAL HIGH (ref 5–15)
BUN: 10 mg/dL (ref 6–20)
CO2: 18 mmol/L — ABNORMAL LOW (ref 22–32)
Calcium: 10.1 mg/dL (ref 8.9–10.3)
Chloride: 95 mmol/L — ABNORMAL LOW (ref 98–111)
Creatinine, Ser: 0.94 mg/dL (ref 0.44–1.00)
GFR calc Af Amer: >60 mL/min (ref >60)
GFR calc non Af Amer: >60 mL/min (ref >60)
Glucose, Bld: 85 mg/dL (ref 70–99)
Potassium: 4.1 mmol/L (ref 3.5–5.1)
Sodium: 135 mmol/L (ref 135–145)

## 2020-02-05 LAB — I-STAT BETA HCG BLOOD, ED (MC, WL, AP ONLY): I-stat hCG, quantitative: <5 m[IU]/mL (ref <5)

## 2020-02-05 LAB — LACTIC ACID, PLASMA: Lactic Acid, Venous: 1.4 mmol/L (ref 0.5–1.9)

## 2020-02-05 LAB — SALICYLATE LEVEL: Salicylate Lvl: <7 mg/dL — ABNORMAL LOW (ref 7.0–30.0)

## 2020-02-05 LAB — POC SARS CORONAVIRUS 2 AG -  ED: SARS Coronavirus 2 Ag: NEGATIVE

## 2020-02-05 LAB — D-DIMER, QUANTITATIVE: D-Dimer, Quant: <0.27 ug/mL-FEU (ref 0.00–0.50)

## 2020-02-05 MED ORDER — PREDNISONE 20 MG PO TABS: 40.0000 mg | ORAL_TABLET | Freq: Every day | ORAL | 0 refills | Status: DC

## 2020-02-05 MED ORDER — LACTATED RINGERS IV BOLUS
1000.0000 mL | Freq: Once | INTRAVENOUS | Status: AC
  Administered 2020-02-05: 1000 mL via INTRAVENOUS

## 2020-02-05 NOTE — ED Provider Notes (Signed)
[facility] EMERGENCY DEPARTMENT Provider Note   CSN: [id number] Arrival date & time: 02/05/20  4680     History Chief Complaint  Patient presents with  . Shortness of Breath    Michelle Braun is a 34 y.o. female with history of asthma who presents with cough and shortness of breath.  Patient states that 2 days ago she started to have productive cough and has had gradually worsening shortness of breath.  She reports associated wheezing has been using her inhaler with some relief.  Shortness of breath was really bad this morning and so she came to the emergency department.  It is worse with exertion. She states she used her inhaler about 2 times before she came in.  Think she has had Covid back in December when she had febrile illness with URI symptoms, anosmia and a cough.  She has ongoing anosmia.  She reports fever yesterday and has had chills and sweats.  She denies URI symptoms today.  She has been taking Mucinex for cough.  No chest pain and the shortness of breath feels better than this morning.  No abdominal pain, nausea or vomiting.  No leg swelling or calf pain.  She states that her arms and legs feel numb and that started today. She has a Mirena IUD. No recent surgery/travel/immobilization, hx of cancer, hemoptysis, prior DVT/PE.  HPI     Past Medical History:  Diagnosis Date  . Asthma   . Asthma due to seasonal allergies   . Chronic back pain   . Gastroparesis   . Migraine     Patient Active Problem List   Diagnosis Date Noted  . 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 Chronic migraine 02/23/2017    Past Surgical History:  Procedure Laterality Date  . BACK SURGERY  11/14/2016   SI Joint fusion - left side  . CYST EXCISION     Left side of head  . mole removed     Chest  . WISDOM TOOTH EXTRACTION       OB History   No obstetric history on file.     Family History  Problem Relation Age of Onset  . Migraines Mother   .  Healthy Father   . Heart disease Paternal Grandfather     Social History   Tobacco Use  . Smoking status: Never Smoker  . Smokeless tobacco: Never Used  Substance Use Topics  . Alcohol use: Yes    Comment: Socially - twice weekly  . Drug use: No    Home Medications Prior to Admission medications   Medication Sig Start Date End Date Taking? Authorizing Provider  albuterol (PROVENTIL HFA;VENTOLIN HFA) 108 (90 BASE) MCG/ACT inhaler Inhale 2 puffs into the lungs every 4 (four) hours as needed for wheezing or shortness of breath.    [provider]  atenolol (TENORMIN) 50 MG tablet Take 50 mg by mouth daily as needed (blood pressure).    [provider]  beclomethasone (QVAR) 80 MCG/ACT inhaler Inhale 2 puffs into the lungs as needed.     [provider]  Bepotastine Besilate (BEPREVE) 1.5 % SOLN Apply 1 drop to eye as needed.     [provider]  buPROPion (WELLBUTRIN XL) 300 MG 24 hr tablet Take 300 mg by mouth every morning.    [provider]  celecoxib (CELEBREX) 200 MG capsule Take 200 mg by mouth daily.    [provider]  cyclobenzaprine (FLEXERIL) 10 MG tablet Take  10 mg by mouth 3 (three) times daily as needed for muscle spasms.    [provider]  EPINEPHrine (EPIPEN) 0.3 mg/0.3 mL IJ SOAJ injection Inject 0.3 mg into the muscle once as needed (allergic reaction.).    [provider]  esomeprazole (NEXIUM) 40 MG capsule Take 40 mg by mouth as needed.     [provider]  gabapentin (NEURONTIN) 300 MG capsule 3 (three) times daily. 01/27/17   [provider]  levocetirizine (XYZAL) 5 MG tablet Take 5 mg by mouth daily.     [provider]  levonorgestrel (MIRENA) 20 MCG/24HR IUD 1 each by Intrauterine route once.    [provider]  methylphenidate [facility]) 30 MG/9HR daily. 12/09/16   [provider]  metoCLOPramide (REGLAN) 10 MG tablet Take 10 mg by mouth See admin  instructions. Thirty minutes before each meal as needed for stomach upset.    [provider]  mometasone (NASONEX) 50 MCG/ACT nasal spray Place 2 sprays into the nose as needed.     [provider]  montelukast (SINGULAIR) 10 MG tablet Take 10 mg by mouth at bedtime.    [provider]  morphine (MS CONTIN) 15 MG 12 hr tablet Take 15 mg by mouth every 12 (twelve) hours.    [provider]  morphine (MSIR) 15 MG tablet Take 15 mg by mouth every 4 (four) hours as needed for severe pain.    [provider]  ondansetron (ZOFRAN) 8 MG tablet Take 8 mg by mouth every 8 (eight) hours as needed for nausea or vomiting.    [provider]  oxyCODONE-acetaminophen (PERCOCET) 10-325 MG tablet every 8 (eight) hours as needed. 01/29/17   [provider]  promethazine (PHENERGAN) 25 MG tablet Take 25 mg by mouth every 8 (eight) hours as needed for nausea or vomiting.    [provider]  ranitidine (ZANTAC) 75 MG tablet Take 75 mg by mouth daily as needed for heartburn.    [provider]  rizatriptan (MAXALT-MLT) 10 MG disintegrating tablet Take 1 tablet (10 mg total) by mouth as needed. May repeat in 2 hours if needed 02/23/17   Levert Feinstein, MD  sertraline (ZOLOFT) 100 MG tablet Take 200 mg by mouth every morning.    [provider]  sucralfate (CARAFATE) 1 GM/10ML suspension Take 1 g by mouth 4 (four) times daily as needed.     [provider]  topiramate (TOPAMAX) 50 MG tablet Take 2 tablets (100 mg total) by mouth at bedtime. 02/23/17   Levert Feinstein, MD    Allergies    Tramadol  Review of Systems   Review of Systems  Constitutional: Positive for chills, diaphoresis and fever.  Respiratory: Positive for cough, shortness of breath and wheezing.   Cardiovascular: Negative for chest pain and leg swelling.  Gastrointestinal: Negative for abdominal pain, nausea and vomiting.  Neurological: Negative for syncope and  light-headedness.  All other systems reviewed and are negative.   Physical Exam Updated Vital Signs BP (!) 137/126   Pulse (!) 120   Temp (!) 97.4 F (36.3 C) (Oral)   Resp (!) 22   Ht 6' (1.829 m)   Wt 110 kg   LMP 02/04/2020   SpO2 100%   BMI 32.89 kg/m   Physical Exam Vitals and nursing note reviewed.  Constitutional:      General: She is not in acute distress.    Appearance: Normal appearance. She is well-developed. She is not ill-appearing.  Comments: Cooperative. Tachypneic  HENT:     Head: Normocephalic and atraumatic.  Eyes:     General: No scleral icterus.       Right eye: No discharge.        Left eye: No discharge.     Conjunctiva/sclera: Conjunctivae normal.     Pupils: Pupils are equal, round, and reactive to light.  Cardiovascular:     Rate and Rhythm: Normal rate and regular rhythm.     Pulses: Normal pulses.     Heart sounds: Normal heart sounds.  Pulmonary:     Effort: Pulmonary effort is normal. Tachypnea present. No respiratory distress.     Breath sounds: Normal breath sounds. No stridor. No wheezing, rhonchi or rales.  Chest:     Chest wall: No tenderness.  Abdominal:     General: There is no distension.  Musculoskeletal:     Cervical back: Normal range of motion.     Right lower leg: No edema.     Left lower leg: No edema.  Skin:    General: Skin is warm and dry.  Neurological:     Mental Status: She is alert and oriented to person, place, and time.  Psychiatric:        Behavior: Behavior normal.     ED Results / Procedures / Treatments   Labs (all labs ordered are listed, but only abnormal results are displayed) Labs Reviewed  BASIC METABOLIC PANEL - Abnormal; Notable for the following components:      Result Value   Chloride 95 (*)    CO2 18 (*)    Anion gap 22 (*)    All other components within normal limits  CBC WITH DIFFERENTIAL/PLATELET  D-DIMER, QUANTITATIVE (NOT AT [facility])  SALICYLATE LEVEL  LACTIC ACID, PLASMA    LACTIC ACID, PLASMA  I-STAT BETA HCG BLOOD, ED ([facility], [facility], [facility] ONLY)  POC SARS CORONAVIRUS 2 AG -  ED    EKG EKG Interpretation  Date/Time:  Wednesday February 05 2020 07:21:17 EST Ventricular Rate:  106 PR Interval:    QRS Duration: 88 QT Interval:  350 QTC Calculation: 465 R Axis:   48 Text Interpretation: Sinus tachycardia No previous ECGs available Confirmed by Fredia Sorrow [phone]) on 02/05/2020 7:26:11 AM   Radiology DG Chest Port 1 View  Result Date: 02/05/2020 CLINICAL DATA:  Shortness of breath EXAM: PORTABLE CHEST 1 VIEW COMPARISON:  07/01/2011 FINDINGS: The heart size and mediastinal contours are within normal limits. No focal airspace consolidation, pleural effusion, or pneumothorax. The visualized skeletal structures are unremarkable. IMPRESSION: No acute cardiopulmonary findings. Electronically Signed   By: Davina Poke D.O.   On: 02/05/2020 08:01    Procedures Procedures (including critical care time)  Medications Ordered in ED Medications - No data to display  ED Course  I have reviewed the triage vital signs and the nursing notes.  Pertinent labs & imaging results that were available during my care of the patient were reviewed by me and considered in my medical decision making (see chart for details).  34 year old female presents with SOB and cough. She reports wheezing as well and has been using her inhaler. On exam she is not in respiratory distress but has increased work of breathing. Heart rate is fast and regular. Lungs are CTA. There is no lower extremity edema or tenderness. Will obtain EKG, CXR, labs  EKG is sinus tachycardia. CXR is negative. Rapid COVID is negative.  9:24 AM Rechecked pt. She feels better and  she has not had any interventions here. Labs reviewed shows low bicarb (CO2 18) and high anion gap (22). She states she is always dehydrated. She also has been taking Aspirin but states that she doesn't take a lot of it, just a dose yesterday and  today. CBC is normal. Electrolytes are normal. D-dimer is negative. Will give 1L of fluid and check ASA levels and Lactate.  10:41 AM Lactate and ASA are normal. She continues to feel improved. I think mild tachycardia is from albuterol use and mild dehydration. Will d/c with course of steroids. We discussed a PCR COVID test but ultimately she declined this. Advised return if worsening  Michelle Braun was evaluated in Emergency Department on 02/05/2020 for the symptoms described in the history of present illness. She was evaluated in the context of the global COVID-19 pandemic, which necessitated consideration that the patient might be at risk for infection with the SARS-CoV-2 virus that causes COVID-19. Institutional protocols and algorithms that pertain to the evaluation of patients at risk for COVID-19 are in a state of rapid change based on information released by regulatory bodies including the CDC and federal and state organizations. These policies and algorithms were followed during the patient's care in the ED.    MDM Rules/Calculators/A&P                       Final Clinical Impression(s) / ED Diagnoses Final diagnoses:  Shortness of breath    Rx / DC Orders ED Discharge Orders    None       Bethel Born, PA-C 02/05/20 1043    Vanetta Mulders, MD 02/09/20 1715

## 2020-02-05 NOTE — ED Triage Notes (Signed)
Patient reports SOB this morning with occasional productive cough , no fever or chills , patient stated history of asthma .

## 2020-02-05 NOTE — Discharge Instructions (Signed)
Take Prednisone for the next 5 days Continue to use your inhaler as needed for shortness of breath or wheezing Take Mucinex or Robitussin as needed for cough Please return if you are worsening

## 2020-02-11 DIAGNOSIS — F902 Attention-deficit hyperactivity disorder, combined type: Secondary | ICD-10-CM | POA: Diagnosis not present

## 2020-02-11 DIAGNOSIS — F331 Major depressive disorder, recurrent, moderate: Secondary | ICD-10-CM | POA: Diagnosis not present

## 2020-05-13 DIAGNOSIS — M7062 Trochanteric bursitis, left hip: Secondary | ICD-10-CM | POA: Diagnosis not present

## 2020-05-13 DIAGNOSIS — M533 Sacrococcygeal disorders, not elsewhere classified: Secondary | ICD-10-CM | POA: Diagnosis not present

## 2020-05-13 DIAGNOSIS — M7061 Trochanteric bursitis, right hip: Secondary | ICD-10-CM | POA: Diagnosis not present

## 2020-05-21 DIAGNOSIS — M7062 Trochanteric bursitis, left hip: Secondary | ICD-10-CM | POA: Diagnosis not present

## 2020-05-21 DIAGNOSIS — M7061 Trochanteric bursitis, right hip: Secondary | ICD-10-CM | POA: Diagnosis not present

## 2020-05-21 DIAGNOSIS — Y939 Activity, unspecified: Secondary | ICD-10-CM | POA: Diagnosis not present

## 2020-06-01 DIAGNOSIS — Y939 Activity, unspecified: Secondary | ICD-10-CM | POA: Diagnosis not present

## 2020-06-01 DIAGNOSIS — M7062 Trochanteric bursitis, left hip: Secondary | ICD-10-CM | POA: Diagnosis not present

## 2020-06-01 DIAGNOSIS — M533 Sacrococcygeal disorders, not elsewhere classified: Secondary | ICD-10-CM | POA: Diagnosis not present

## 2020-10-04 ENCOUNTER — Inpatient Hospital Stay (HOSPITAL_BASED_OUTPATIENT_CLINIC_OR_DEPARTMENT_OTHER)
Admission: EM | Admit: 2020-10-04 | Discharge: 2020-10-15 | DRG: 439 | Disposition: A | Discharge status: Home / Self Care | Payer: BC Managed Care – PPO | Attending: Internal Medicine | Admitting: Internal Medicine

## 2020-10-04 ENCOUNTER — Other Ambulatory Visit: Payer: Self-pay

## 2020-10-04 ENCOUNTER — Emergency Department (HOSPITAL_BASED_OUTPATIENT_CLINIC_OR_DEPARTMENT_OTHER): Payer: BC Managed Care – PPO

## 2020-10-04 ENCOUNTER — Encounter (HOSPITAL_BASED_OUTPATIENT_CLINIC_OR_DEPARTMENT_OTHER): Payer: Self-pay | Admitting: Emergency Medicine

## 2020-10-04 DIAGNOSIS — Z8249 Family history of ischemic heart disease and other diseases of the circulatory system: Secondary | ICD-10-CM | POA: Diagnosis not present

## 2020-10-04 DIAGNOSIS — D72829 Elevated white blood cell count, unspecified: Secondary | ICD-10-CM

## 2020-10-04 DIAGNOSIS — R109 Unspecified abdominal pain: Secondary | ICD-10-CM | POA: Diagnosis not present

## 2020-10-04 DIAGNOSIS — J90 Pleural effusion, not elsewhere classified: Secondary | ICD-10-CM | POA: Diagnosis present

## 2020-10-04 DIAGNOSIS — Z981 Arthrodesis status: Secondary | ICD-10-CM

## 2020-10-04 DIAGNOSIS — R935 Abnormal findings on diagnostic imaging of other abdominal regions, including retroperitoneum: Secondary | ICD-10-CM | POA: Diagnosis not present

## 2020-10-04 DIAGNOSIS — K8521 Alcohol induced acute pancreatitis with uninfected necrosis: Principal | ICD-10-CM | POA: Diagnosis present

## 2020-10-04 DIAGNOSIS — R112 Nausea with vomiting, unspecified: Secondary | ICD-10-CM | POA: Diagnosis not present

## 2020-10-04 DIAGNOSIS — I10 Essential (primary) hypertension: Secondary | ICD-10-CM | POA: Diagnosis not present

## 2020-10-04 DIAGNOSIS — R251 Tremor, unspecified: Secondary | ICD-10-CM | POA: Diagnosis not present

## 2020-10-04 DIAGNOSIS — K760 Fatty (change of) liver, not elsewhere classified: Secondary | ICD-10-CM | POA: Diagnosis present

## 2020-10-04 DIAGNOSIS — K8591 Acute pancreatitis with uninfected necrosis, unspecified: Secondary | ICD-10-CM | POA: Diagnosis not present

## 2020-10-04 DIAGNOSIS — J9811 Atelectasis: Secondary | ICD-10-CM | POA: Diagnosis not present

## 2020-10-04 DIAGNOSIS — F419 Anxiety disorder, unspecified: Secondary | ICD-10-CM

## 2020-10-04 DIAGNOSIS — E872 Acidosis: Secondary | ICD-10-CM | POA: Diagnosis present

## 2020-10-04 DIAGNOSIS — Z7982 Long term (current) use of aspirin: Secondary | ICD-10-CM

## 2020-10-04 DIAGNOSIS — R188 Other ascites: Secondary | ICD-10-CM | POA: Diagnosis not present

## 2020-10-04 DIAGNOSIS — R1013 Epigastric pain: Secondary | ICD-10-CM | POA: Diagnosis not present

## 2020-10-04 DIAGNOSIS — K8689 Other specified diseases of pancreas: Secondary | ICD-10-CM | POA: Diagnosis not present

## 2020-10-04 DIAGNOSIS — R1084 Generalized abdominal pain: Secondary | ICD-10-CM | POA: Diagnosis not present

## 2020-10-04 DIAGNOSIS — F1010 Alcohol abuse, uncomplicated: Secondary | ICD-10-CM | POA: Diagnosis not present

## 2020-10-04 DIAGNOSIS — E876 Hypokalemia: Secondary | ICD-10-CM | POA: Diagnosis present

## 2020-10-04 DIAGNOSIS — E669 Obesity, unspecified: Secondary | ICD-10-CM | POA: Diagnosis present

## 2020-10-04 DIAGNOSIS — Z975 Presence of (intrauterine) contraceptive device: Secondary | ICD-10-CM | POA: Diagnosis not present

## 2020-10-04 DIAGNOSIS — K863 Pseudocyst of pancreas: Secondary | ICD-10-CM | POA: Diagnosis not present

## 2020-10-04 DIAGNOSIS — Z20822 Contact with and (suspected) exposure to covid-19: Secondary | ICD-10-CM | POA: Diagnosis present

## 2020-10-04 DIAGNOSIS — R7401 Elevation of levels of liver transaminase levels: Secondary | ICD-10-CM

## 2020-10-04 DIAGNOSIS — K852 Alcohol induced acute pancreatitis without necrosis or infection: Secondary | ICD-10-CM | POA: Diagnosis not present

## 2020-10-04 DIAGNOSIS — K859 Acute pancreatitis without necrosis or infection, unspecified: Secondary | ICD-10-CM | POA: Diagnosis not present

## 2020-10-04 DIAGNOSIS — Z6831 Body mass index (BMI) 31.0-31.9, adult: Secondary | ICD-10-CM

## 2020-10-04 DIAGNOSIS — F3289 Other specified depressive episodes: Secondary | ICD-10-CM | POA: Diagnosis not present

## 2020-10-04 DIAGNOSIS — M545 Low back pain, unspecified: Secondary | ICD-10-CM | POA: Diagnosis present

## 2020-10-04 DIAGNOSIS — R1011 Right upper quadrant pain: Secondary | ICD-10-CM | POA: Diagnosis not present

## 2020-10-04 DIAGNOSIS — G894 Chronic pain syndrome: Secondary | ICD-10-CM | POA: Diagnosis present

## 2020-10-04 DIAGNOSIS — D735 Infarction of spleen: Secondary | ICD-10-CM | POA: Diagnosis not present

## 2020-10-04 DIAGNOSIS — Z79899 Other long term (current) drug therapy: Secondary | ICD-10-CM | POA: Diagnosis not present

## 2020-10-04 DIAGNOSIS — N390 Urinary tract infection, site not specified: Secondary | ICD-10-CM | POA: Diagnosis not present

## 2020-10-04 DIAGNOSIS — K861 Other chronic pancreatitis: Secondary | ICD-10-CM | POA: Diagnosis not present

## 2020-10-04 DIAGNOSIS — F32A Depression, unspecified: Secondary | ICD-10-CM | POA: Diagnosis present

## 2020-10-04 DIAGNOSIS — Z7952 Long term (current) use of systemic steroids: Secondary | ICD-10-CM

## 2020-10-04 DIAGNOSIS — K567 Ileus, unspecified: Secondary | ICD-10-CM | POA: Diagnosis not present

## 2020-10-04 DIAGNOSIS — R739 Hyperglycemia, unspecified: Secondary | ICD-10-CM | POA: Diagnosis present

## 2020-10-04 DIAGNOSIS — J45909 Unspecified asthma, uncomplicated: Secondary | ICD-10-CM | POA: Diagnosis present

## 2020-10-04 LAB — CBC
HCT: 51.5 % — ABNORMAL HIGH (ref 36.0–46.0)
Hemoglobin: 17.1 g/dL — ABNORMAL HIGH (ref 12.0–15.0)
MCH: 31.5 pg (ref 26.0–34.0)
MCHC: 33.2 g/dL (ref 30.0–36.0)
MCV: 94.8 fL (ref 80.0–100.0)
Platelets: 293 10*3/uL (ref 150–400)
RBC: 5.43 MIL/uL — ABNORMAL HIGH (ref 3.87–5.11)
RDW: 12.8 % (ref 11.5–15.5)
WBC: 18.2 10*3/uL — ABNORMAL HIGH (ref 4.0–10.5)
nRBC: 0 % (ref 0.0–0.2)

## 2020-10-04 LAB — LIPASE, BLOOD: Lipase: 1287 U/L — ABNORMAL HIGH (ref 11–51)

## 2020-10-04 LAB — COMPREHENSIVE METABOLIC PANEL
ALT: 50 U/L — ABNORMAL HIGH (ref 0–44)
AST: 67 U/L — ABNORMAL HIGH (ref 15–41)
Albumin: 4.7 g/dL (ref 3.5–5.0)
Alkaline Phosphatase: 84 U/L (ref 38–126)
Anion gap: 14 (ref 5–15)
BUN: 11 mg/dL (ref 6–20)
CO2: 23 mmol/L (ref 22–32)
Calcium: 9.8 mg/dL (ref 8.9–10.3)
Chloride: 98 mmol/L (ref 98–111)
Creatinine, Ser: 0.83 mg/dL (ref 0.44–1.00)
GFR, Estimated: >60 mL/min (ref >60)
Glucose, Bld: 159 mg/dL — ABNORMAL HIGH (ref 70–99)
Potassium: 4.4 mmol/L (ref 3.5–5.1)
Sodium: 135 mmol/L (ref 135–145)
Total Bilirubin: 1.8 mg/dL — ABNORMAL HIGH (ref 0.3–1.2)
Total Protein: 8.4 g/dL — ABNORMAL HIGH (ref 6.5–8.1)

## 2020-10-04 LAB — URINALYSIS, ROUTINE W REFLEX MICROSCOPIC
Glucose, UA: NEGATIVE mg/dL
Ketones, ur: >80 mg/dL — AB
Leukocytes,Ua: NEGATIVE
Nitrite: POSITIVE — AB
Protein, ur: >300 mg/dL — AB
Specific Gravity, Urine: >1.03 — ABNORMAL HIGH (ref 1.005–1.030)
pH: 6.5 (ref 5.0–8.0)

## 2020-10-04 LAB — URINALYSIS, MICROSCOPIC (REFLEX)

## 2020-10-04 LAB — LACTIC ACID, PLASMA: Lactic Acid, Venous: 1.3 mmol/L (ref 0.5–1.9)

## 2020-10-04 LAB — HEMOGLOBIN A1C
Hgb A1c MFr Bld: 4.6 % — ABNORMAL LOW (ref 4.8–5.6)
Mean Plasma Glucose: 85.32 mg/dL

## 2020-10-04 LAB — RESPIRATORY PANEL BY RT PCR (FLU A&B, COVID)
Influenza A by PCR: NEGATIVE
Influenza B by PCR: NEGATIVE
SARS Coronavirus 2 by RT PCR: NEGATIVE

## 2020-10-04 LAB — PREGNANCY, URINE: Preg Test, Ur: NEGATIVE

## 2020-10-04 MED ORDER — SODIUM CHLORIDE 0.9 % IV BOLUS
1000.0000 mL | Freq: Once | INTRAVENOUS | Status: AC
  Administered 2020-10-04: 1000 mL via INTRAVENOUS

## 2020-10-04 MED ORDER — MORPHINE SULFATE (PF) 2 MG/ML IV SOLN
2.0000 mg | INTRAVENOUS | Status: DC | PRN
  Administered 2020-10-04 – 2020-10-05 (×6): 2 mg via INTRAVENOUS
  Filled 2020-10-04 (×6): qty 1

## 2020-10-04 MED ORDER — IOHEXOL 300 MG/ML  SOLN
100.0000 mL | Freq: Once | INTRAMUSCULAR | Status: AC | PRN
  Administered 2020-10-04: 100 mL via INTRAVENOUS

## 2020-10-04 MED ORDER — LORAZEPAM 1 MG PO TABS: 1.0000 mg | ORAL_TABLET | ORAL | Status: AC | PRN

## 2020-10-04 MED ORDER — ONDANSETRON HCL 4 MG/2ML IJ SOLN
4.0000 mg | Freq: Four times a day (QID) | INTRAMUSCULAR | Status: DC | PRN
  Administered 2020-10-05: 4 mg via INTRAVENOUS
  Filled 2020-10-04: qty 2

## 2020-10-04 MED ORDER — LORAZEPAM 2 MG/ML IJ SOLN: 0.0000 mg | Freq: Four times a day (QID) | INTRAMUSCULAR | Status: AC

## 2020-10-04 MED ORDER — LORAZEPAM 2 MG/ML IJ SOLN
0.0000 mg | Freq: Two times a day (BID) | INTRAMUSCULAR | Status: AC
  Administered 2020-10-07 – 2020-10-08 (×2): 1 mg via INTRAVENOUS
  Filled 2020-10-04 (×2): qty 1

## 2020-10-04 MED ORDER — THIAMINE HCL 100 MG/ML IJ SOLN
100.0000 mg | Freq: Every day | INTRAMUSCULAR | Status: DC
  Administered 2020-10-04: 100 mg via INTRAVENOUS
  Filled 2020-10-04 (×2): qty 2

## 2020-10-04 MED ORDER — HEPARIN SODIUM (PORCINE) 5000 UNIT/ML IJ SOLN
5000.0000 [IU] | Freq: Three times a day (TID) | INTRAMUSCULAR | Status: DC
  Administered 2020-10-04 – 2020-10-05 (×2): 5000 [IU] via SUBCUTANEOUS
  Filled 2020-10-04 (×2): qty 1

## 2020-10-04 MED ORDER — MORPHINE SULFATE (PF) 4 MG/ML IV SOLN
4.0000 mg | Freq: Once | INTRAVENOUS | Status: AC
  Administered 2020-10-04: 4 mg via INTRAVENOUS
  Filled 2020-10-04: qty 1

## 2020-10-04 MED ORDER — ACETAMINOPHEN 650 MG RE SUPP: 650.0000 mg | Freq: Four times a day (QID) | RECTAL | Status: DC | PRN

## 2020-10-04 MED ORDER — ALBUTEROL SULFATE HFA 108 (90 BASE) MCG/ACT IN AERS
2.0000 | INHALATION_SPRAY | RESPIRATORY_TRACT | Status: DC | PRN
  Administered 2020-10-08 – 2020-10-09 (×2): 2 via RESPIRATORY_TRACT
  Filled 2020-10-04 (×2): qty 6.7

## 2020-10-04 MED ORDER — FENTANYL CITRATE (PF) 100 MCG/2ML IJ SOLN
50.0000 ug | INTRAMUSCULAR | Status: DC | PRN
  Administered 2020-10-04: 50 ug via NASAL
  Filled 2020-10-04: qty 2

## 2020-10-04 MED ORDER — LORAZEPAM 2 MG/ML IJ SOLN: 1.0000 mg | INTRAMUSCULAR | Status: AC | PRN

## 2020-10-04 MED ORDER — HYDROMORPHONE HCL 1 MG/ML IJ SOLN
0.5000 mg | Freq: Once | INTRAMUSCULAR | Status: AC
  Administered 2020-10-04: 0.5 mg via INTRAVENOUS
  Filled 2020-10-04: qty 1

## 2020-10-04 MED ORDER — HYDROMORPHONE HCL 1 MG/ML IJ SOLN
1.0000 mg | Freq: Once | INTRAMUSCULAR | Status: AC
  Administered 2020-10-04: 1 mg via INTRAVENOUS
  Filled 2020-10-04: qty 1

## 2020-10-04 MED ORDER — SODIUM CHLORIDE 0.9 % IV SOLN: INTRAVENOUS | Status: DC

## 2020-10-04 MED ORDER — ONDANSETRON HCL 4 MG PO TABS
4.0000 mg | ORAL_TABLET | Freq: Four times a day (QID) | ORAL | Status: DC | PRN
  Filled 2020-10-04: qty 1

## 2020-10-04 MED ORDER — ACETAMINOPHEN 325 MG PO TABS
650.0000 mg | ORAL_TABLET | Freq: Four times a day (QID) | ORAL | Status: DC | PRN
  Administered 2020-10-05: 650 mg via ORAL
  Filled 2020-10-04: qty 2

## 2020-10-04 MED ORDER — ONDANSETRON HCL 4 MG/2ML IJ SOLN
4.0000 mg | Freq: Once | INTRAMUSCULAR | Status: AC
  Administered 2020-10-04: 4 mg via INTRAVENOUS
  Filled 2020-10-04 (×2): qty 2

## 2020-10-04 MED ORDER — FLUTICASONE PROPIONATE 50 MCG/ACT NA SUSP
1.0000 | Freq: Every day | NASAL | Status: DC
  Administered 2020-10-09 – 2020-10-11 (×3): 1 via NASAL
  Filled 2020-10-04: qty 16

## 2020-10-04 MED ORDER — THIAMINE HCL 100 MG PO TABS
100.0000 mg | ORAL_TABLET | Freq: Every day | ORAL | Status: DC
  Administered 2020-10-05 – 2020-10-15 (×11): 100 mg via ORAL
  Filled 2020-10-04 (×11): qty 1

## 2020-10-04 NOTE — H&P (Signed)
TRH H&P    Patient Demographics:    Michelle Braun, is a 34 y.o. female  MRN: [id number]  DOB - Oct 14, 1986  Admit Date - 10/04/2020  Referring MD/NP/PA: Lockie Mola  Outpatient Primary MD for the patient is Patient, No Pcp Per  Patient coming from: Home  Chief complaint- Abdominal pain and nausea   HPI:    Michelle Braun is a very pleasant 34 y.o. 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 This lasted throughout the weekend and she went to an urgent care and given zofran and was directed to the ED. Admits that she drinks 1-2 shots of vodka nightly since the beginning of the pandemic. No abdominal surgeries, no heavy NSAID use. No diarrhea. States that the morphine initially helped her in the ED but now is not really helping her pain. Admits to having an appetite and willing to try to eat.   In the ED, she was afebrile, tachycardic, hypertensive and stable on room air. Notable labs: Lipase 1287, AST 67->58, ALT 50-> 37, T bili 1.8, WBC 18.2->20, Hb 17.1, Lactic acid 1.3-> 2.0, Mg 1.5 this AM. UA contaminated. RUQ [place]: hepatic steatosis, no cholelithiasis, no acute abnormality. CT abd/pelvis W contrast: acute pancreatitis with a small hypoencancement area of the pancreatic head/uncinate could be edema vs. Early necrosis without peripancreatic fluid collection or walled off necrosis.     Review of systems:    Review of Systems  Constitutional: Negative for fever.  Cardiovascular: Negative for chest pain.  Gastrointestinal: Positive for abdominal pain. Negative for diarrhea.  Genitourinary: Negative.   Musculoskeletal:       Chronic low back pain  All other systems  reviewed and are negative.    All other systems reviewed and are negative.    Past History of the following :    Past Medical History:  Diagnosis Date  . Asthma   . Asthma due to seasonal allergies   . Chronic back pain   . Gastroparesis   . Migraine       Past Surgical History:  Procedure Laterality Date  . BACK SURGERY  11/14/2016   SI Joint fusion - left side  . CYST EXCISION     Left side of head  . mole removed     Chest  . WISDOM TOOTH EXTRACTION        Social History:      Social History   Tobacco Use  . Smoking status: Never Smoker  . Smokeless tobacco: Never Used  Substance Use Topics  . Alcohol use: Yes    Comment: Socially - twice weekly       Family History :     Family History  Problem Relation Age of Onset  . Migraines Mother   . Healthy Father   . Heart disease Paternal Grandfather  Home Medications:   Prior to Admission medications   Medication Sig Start Date End Date Taking? Authorizing Provider  albuterol (PROVENTIL HFA;VENTOLIN HFA) 108 (90 BASE) MCG/ACT inhaler Inhale 2 puffs into the lungs every 4 (four) hours as needed for wheezing or shortness of breath.    [provider]  aspirin EC 81 MG tablet Take 162 mg by mouth once.    [provider]  atenolol (TENORMIN) 50 MG tablet Take 50 mg by mouth daily as needed (blood pressure). Also for nervousness due to public speaking    [provider]  buPROPion (WELLBUTRIN XL) 300 MG 24 hr tablet Take 300 mg by mouth every morning.    [provider]  EPINEPHrine (EPIPEN) 0.3 mg/0.3 mL IJ SOAJ injection Inject 0.3 mg into the muscle once as needed (allergic reaction.).    [provider]  guaiFENesin (MUCINEX) 600 MG 12 hr tablet Take 600 mg by mouth 2 (two) times daily as needed for cough or to loosen phlegm.    [provider]  levonorgestrel (MIRENA) 20 MCG/24HR IUD 1 each by Intrauterine route once.    [provider]  metoCLOPramide (REGLAN) 10 MG tablet Take 10 mg by mouth See admin instructions. Thirty minutes before each meal as needed for stomach upset.    [provider]  mometasone (NASONEX) 50 MCG/ACT nasal spray Place 2 sprays into the nose as needed (nasal congestion).     [provider]  montelukast (SINGULAIR) 10 MG tablet Take 10 mg by mouth at bedtime.    [provider]  phenylephrine (SUDAFED PE) 10 MG TABS tablet Take 10 mg by mouth every 4 (four) hours as needed (congestion).    [provider]  predniSONE (DELTASONE) 20 MG tablet Take 2 tablets (40 mg total) by mouth daily. 02/05/20   Bethel Born, PA-C  sertraline (ZOLOFT) 100 MG tablet Take 200 mg by mouth every morning.    [provider]  VYVANSE 60 MG capsule Take 60 mg by mouth every morning. 01/29/20   [provider]  rizatriptan (MAXALT-MLT) 10 MG disintegrating tablet Take 1 tablet (10 mg total) by mouth as needed. May repeat in 2 hours if needed Patient not taking: Reported on 02/05/2020 02/23/17 02/05/20  Levert Feinstein, MD  topiramate (TOPAMAX) 50 MG tablet Take 2 tablets (100 mg total) by mouth at bedtime. Patient not taking: Reported on 02/05/2020 02/23/17 02/05/20  Levert Feinstein, MD     Allergies:     Allergies  Allergen Reactions  . Tramadol     anaphylaxis     Physical Exam:   Vitals  Blood pressure (!) 124/110, pulse (!) 130, temperature 98 F (36.7 C), temperature source Oral, resp. rate 18, height 6' (1.829 m), weight 106.6 kg, SpO2 100 %. Physical Exam Vitals and nursing note reviewed.  Constitutional:      General: She is not in acute distress.    Appearance: Normal appearance. She is obese. She is not toxic-appearing.  HENT:     Head: Normocephalic and atraumatic.  Eyes:     Conjunctiva/sclera: Conjunctivae normal.  Cardiovascular:     Rate and Rhythm: Normal rate and regular rhythm.  Pulmonary:     Effort: Pulmonary effort is normal.     Breath sounds:  Normal breath sounds.  Abdominal:     General: Abdomen is flat.     Palpations: Abdomen is soft.     Tenderness: There is abdominal tenderness in the epigastric area.  Musculoskeletal:  General: No swelling or tenderness.  Skin:    Coloration: Skin is not jaundiced or pale.  Neurological:     Mental Status: She is alert. Mental status is at baseline.  Psychiatric:        Mood and Affect: Mood normal.        Behavior: Behavior normal.       Data Review:    CBC Recent Labs  Lab 10/04/20 1500 10/05/20 0421  WBC 18.2* 20.3*  HGB 17.1* 16.3*  HCT 51.5* 50.2*  PLT 293 183  MCV 94.8 96.0  MCH 31.5 31.2  MCHC 33.2 32.5  RDW 12.8 12.9  LYMPHSABS  --  0.6*  MONOABS  --  1.0  EOSABS  --  0.0  BASOSABS  --  0.1   ------------------------------------------------------------------------------------------------------------------  Results for orders placed or performed during the hospital encounter of 10/04/20 (from the past 48 hour(s))  Lipase, blood     Status: Abnormal   Collection Time: 10/04/20  3:00 PM  Result Value Ref Range   Lipase 1,287 (H) 11 - 51 U/L    Comment: RESULTS CONFIRMED BY MANUAL DILUTION Performed at [facility], [address] [place], [place] [address]   Comprehensive metabolic panel     Status: Abnormal   Collection Time: 10/04/20  3:00 PM  Result Value Ref Range   Sodium 135 135 - 145 mmol/L   Potassium 4.4 3.5 - 5.1 mmol/L   Chloride 98 98 - 111 mmol/L   CO2 23 22 - 32 mmol/L   Glucose, Bld 159 (H) 70 - 99 mg/dL    Comment: Glucose reference range applies only to samples taken after fasting for at least 8 hours.   BUN 11 6 - 20 mg/dL   Creatinine, Ser 5.28 0.44 - 1.00 mg/dL   Calcium 9.8 8.9 - 41.3 mg/dL   Total Protein 8.4 (H) 6.5 - 8.1 g/dL   Albumin 4.7 3.5 - 5.0 g/dL   AST 67 (H) 15 - 41 U/L   ALT 50 (H) 0 - 44 U/L   Alkaline Phosphatase 84 38 - 126 U/L   Total Bilirubin 1.8 (H) 0.3 - 1.2 mg/dL   GFR, Estimated >24  >40 mL/min    Comment: (NOTE) Calculated using the CKD-EPI Creatinine Equation (2021)    Anion gap 14 5 - 15    Comment: Performed at [facility], [address] [place], [place] [address]  CBC     Status: Abnormal   Collection Time: 10/04/20  3:00 PM  Result Value Ref Range   WBC 18.2 (H) 4.0 - 10.5 K/uL   RBC 5.43 (H) 3.87 - 5.11 MIL/uL   Hemoglobin 17.1 (H) 12.0 - 15.0 g/dL   HCT 53.6 (H) 36 - 46 %   MCV 94.8 80.0 - 100.0 fL   MCH 31.5 26.0 - 34.0 pg   MCHC 33.2 30.0 - 36.0 g/dL   RDW 64.4 03.4 - 74.2 %   Platelets 293 150 - 400 K/uL   nRBC 0.0 0.0 - 0.2 %    Comment: Performed at [facility], [address] [place], [place] [address]  Hemoglobin A1c     Status: Abnormal   Collection Time: 10/04/20  3:00 PM  Result Value Ref Range   Hgb A1c MFr Bld 4.6 (L) 4.8 - 5.6 %    Comment: (NOTE) Pre diabetes:          5.7%-6.4%  Diabetes:              >  6.4%  Glycemic control for   <7.0% adults with diabetes    Mean Plasma Glucose 85.32 mg/dL    Comment: Performed at [facility], [address] [place], [place] [address]  Respiratory Panel by RT PCR (Flu A&B, Covid) - Nasopharyngeal Swab     Status: None   Collection Time: 10/04/20  5:28 PM   Specimen: Nasopharyngeal Swab  Result Value Ref Range   SARS Coronavirus 2 by RT PCR NEGATIVE NEGATIVE    Comment: (NOTE) SARS-CoV-2 target nucleic acids are NOT DETECTED.  The SARS-CoV-2 RNA is generally detectable in upper respiratoy specimens during the acute phase of infection. The lowest concentration of SARS-CoV-2 viral copies this assay can detect is 131 copies/mL. A negative result does not preclude SARS-Cov-2 infection and should not be used as the sole basis for treatment or other patient management decisions. A negative result may occur with  improper specimen collection/handling, submission of specimen other than nasopharyngeal swab, presence of viral mutation(s) within the areas targeted  by this assay, and inadequate number of viral copies (<131 copies/mL). A negative result must be combined with clinical observations, patient history, and epidemiological information. The expected result is Negative.  Fact Sheet for Patients:  https://www.moore.com/  Fact Sheet for Healthcare Providers:  https://www.young.biz/  This test is no t yet approved or cleared by the [place] FDA and  has been authorized for detection and/or diagnosis of SARS-CoV-2 by FDA under an Emergency Use Authorization (EUA). This EUA will remain  in effect (meaning this test can be used) for the duration of the COVID-19 declaration under Section 564(b)(1) of the Act, 21 U.S.C. section 360bbb-3(b)(1), unless the authorization is terminated or revoked sooner.     Influenza A by PCR NEGATIVE NEGATIVE   Influenza B by PCR NEGATIVE NEGATIVE    Comment: (NOTE) The Xpert Xpress SARS-CoV-2/FLU/RSV assay is intended as an aid in  the diagnosis of influenza from Nasopharyngeal swab specimens and  should not be used as a sole basis for treatment. Nasal washings and  aspirates are unacceptable for Xpert Xpress SARS-CoV-2/FLU/RSV  testing.  Fact Sheet for Patients: https://www.moore.com/  Fact Sheet for Healthcare Providers: https://www.young.biz/  This test is not yet approved or cleared by the [place] FDA and  has been authorized for detection and/or diagnosis of SARS-CoV-2 by  FDA under an Emergency Use Authorization (EUA). This EUA will remain  in effect (meaning this test can be used) for the duration of the  Covid-19 declaration under Section 564(b)(1) of the Act, 21  U.S.C. section 360bbb-3(b)(1), unless the authorization is  terminated or revoked. Performed at [facility], [address] [place], [place] [address]   Urinalysis, Routine w reflex microscopic Urine, Clean Catch     Status: Abnormal    Collection Time: 10/04/20  6:00 PM  Result Value Ref Range   Color, Urine BROWN (A) YELLOW    Comment: BIOCHEMICALS MAY BE AFFECTED BY COLOR   APPearance CLOUDY (A) CLEAR   Specific Gravity, Urine >1.030 (H) 1.005 - 1.030   pH 6.5 5.0 - 8.0   Glucose, UA NEGATIVE NEGATIVE mg/dL   Hgb urine dipstick TRACE (A) NEGATIVE   Bilirubin Urine LARGE (A) NEGATIVE   Ketones, ur >80 (A) NEGATIVE mg/dL   Protein, ur >932 (A) NEGATIVE mg/dL   Nitrite POSITIVE (A) NEGATIVE   Leukocytes,Ua NEGATIVE NEGATIVE    Comment: Performed at [facility], [address] [place], [place] [address]  Pregnancy,  urine     Status: None   Collection Time: 10/04/20  6:00 PM  Result Value Ref Range   Preg Test, Ur NEGATIVE NEGATIVE    Comment:        THE SENSITIVITY OF THIS METHODOLOGY IS >20 mIU/mL. Performed at [facility], [address] [place], [place] [address]   Urinalysis, Microscopic (reflex)     Status: Abnormal   Collection Time: 10/04/20  6:00 PM  Result Value Ref Range   RBC / HPF 0-5 0 - 5 RBC/hpf   WBC, UA 6-10 0 - 5 WBC/hpf   Bacteria, UA MANY (A) NONE SEEN   Squamous Epithelial / LPF 11-20 0 - 5   Mucus PRESENT    Hyaline Casts, UA PRESENT     Comment: Performed at [facility], [address] [place], [place] [address]  Lactic acid, plasma     Status: None   Collection Time: 10/04/20 10:20 PM  Result Value Ref Range   Lactic Acid, Venous 1.3 0.5 - 1.9 mmol/L    Comment: Performed at [facility], [address] [place], [place] [address]  Comprehensive metabolic panel     Status: Abnormal   Collection Time: 10/05/20  4:21 AM  Result Value Ref Range   Sodium 136 135 - 145 mmol/L   Potassium 4.0 3.5 - 5.1 mmol/L   Chloride 104 98 - 111 mmol/L   CO2 20 (L) 22 - 32 mmol/L   Glucose, Bld 123 (H) 70 - 99 mg/dL    Comment: Glucose reference range applies only to samples taken after fasting for at least 8 hours.   BUN 10 6 - 20 mg/dL    Creatinine, Ser 8.65 0.44 - 1.00 mg/dL   Calcium 8.5 (L) 8.9 - 10.3 mg/dL   Total Protein 7.1 6.5 - 8.1 g/dL   Albumin 3.9 3.5 - 5.0 g/dL   AST 58 (H) 15 - 41 U/L   ALT 37 0 - 44 U/L   Alkaline Phosphatase 69 38 - 126 U/L   Total Bilirubin 1.8 (H) 0.3 - 1.2 mg/dL   GFR, Estimated >78 >46 mL/min    Comment: (NOTE) Calculated using the CKD-EPI Creatinine Equation (2021)    Anion gap 12 5 - 15    Comment: Performed at [facility], [address] [place] [place], [place] [address]  Magnesium     Status: Abnormal   Collection Time: 10/05/20  4:21 AM  Result Value Ref Range   Magnesium 1.5 (L) 1.7 - 2.4 mg/dL    Comment: Performed at [facility], [address] [place], [place] [address]  CBC WITH DIFFERENTIAL     Status: Abnormal   Collection Time: 10/05/20  4:21 AM  Result Value Ref Range   WBC 20.3 (H) 4.0 - 10.5 K/uL   RBC 5.23 (H) 3.87 - 5.11 MIL/uL   Hemoglobin 16.3 (H) 12.0 - 15.0 g/dL   HCT 24.4 (H) 36 - 46 %   MCV 96.0 80.0 - 100.0 fL   MCH 31.2 26.0 - 34.0 pg   MCHC 32.5 30.0 - 36.0 g/dL   RDW 01.0 27.2 - 53.6 %   Platelets 183 150 - 400 K/uL   nRBC 0.0 0.0 - 0.2 %   Neutrophils Relative % 90 %   Neutro Abs 18.4 (H) 1.7 - 7.7 K/uL   Lymphocytes Relative 3 %   Lymphs Abs 0.6 (L) 0.7 - 4.0 K/uL  Monocytes Relative 5 %   Monocytes Absolute 1.0 0.1 - 1.0 K/uL   Eosinophils Relative 0 %   Eosinophils Absolute 0.0 0.0 - 0.5 K/uL   Basophils Relative 1 %   Basophils Absolute 0.1 0.0 - 0.1 K/uL   Immature Granulocytes 1 %   Abs Immature Granulocytes 0.15 (H) 0.00 - 0.07 K/uL    Comment: Performed at [facility], [address] [place], [place] [address]  Lactic acid, plasma     Status: Abnormal   Collection Time: 10/05/20  4:21 AM  Result Value Ref Range   Lactic Acid, Venous 2.0 (HH) 0.5 - 1.9 mmol/L    Comment: CRITICAL RESULT CALLED TO, READ BACK BY AND VERIFIED WITH: Yevette EdwardsHANIN MAYNARD RN @[id number]  10/05/2020 OLSONM Performed at [facility], [address] [place], [place] [address]     Chemistries  Recent Labs  Lab 10/04/20 1500 10/05/20 0421  NA 135 136  K 4.4 4.0  CL 98 104  CO2 23 20*  GLUCOSE 159* 123*  BUN 11 10  CREATININE 0.83 0.78  CALCIUM 9.8 8.5*  MG  --  1.5*  AST 67* 58*  ALT 50* 37  ALKPHOS 84 69  BILITOT 1.8* 1.8*   ------------------------------------------------------------------------------------------------------------------  ------------------------------------------------------------------------------------------------------------------ GFR: Estimated Creatinine Clearance: 136.6 mL/min (by C-G formula based on SCr of 0.78 mg/dL). Liver Function Tests: Recent Labs  Lab 10/04/20 1500 10/05/20 0421  AST 67* 58*  ALT 50* 37  ALKPHOS 84 69  BILITOT 1.8* 1.8*  PROT 8.4* 7.1  ALBUMIN 4.7 3.9   Recent Labs  Lab 10/04/20 1500  LIPASE 1,287*   No results for input(s): AMMONIA in the last 168 hours. Coagulation Profile: No results for input(s): INR, PROTIME in the last 168 hours. Cardiac Enzymes: No results for input(s): CKTOTAL, CKMB, CKMBINDEX, TROPONINI in the last 168 hours. BNP (last 3 results) No results for input(s): PROBNP in the last 8760 hours. HbA1C: Recent Labs    10/04/20 1500  HGBA1C 4.6*   CBG: No results for input(s): GLUCAP in the last 168 hours. Lipid Profile: No results for input(s): CHOL, HDL, LDLCALC, TRIG, CHOLHDL, LDLDIRECT in the last 72 hours. Thyroid Function Tests: No results for input(s): TSH, T4TOTAL, FREET4, T3FREE, THYROIDAB in the last 72 hours. Anemia Panel: No results for input(s): VITAMINB12, FOLATE, FERRITIN, TIBC, IRON, RETICCTPCT in the last 72 hours.  --------------------------------------------------------------------------------------------------------------- Urine analysis:    Component Value Date/Time   COLORURINE BROWN (A) 10/04/2020 1800   APPEARANCEUR CLOUDY (A) 10/04/2020 1800   LABSPEC >1.030 (H) 10/04/2020  1800   PHURINE 6.5 10/04/2020 1800   GLUCOSEU NEGATIVE 10/04/2020 1800   HGBUR TRACE (A) 10/04/2020 1800   BILIRUBINUR LARGE (A) 10/04/2020 1800   KETONESUR >80 (A) 10/04/2020 1800   PROTEINUR >300 (A) 10/04/2020 1800   UROBILINOGEN 1.0 07/30/2014 1631   NITRITE POSITIVE (A) 10/04/2020 1800   LEUKOCYTESUR NEGATIVE 10/04/2020 1800      Imaging Results:    CT ABDOMEN PELVIS W CONTRAST  Result Date: 10/04/2020 CLINICAL DATA:  Acute pancreatitis. EXAM: CT ABDOMEN AND PELVIS WITH CONTRAST TECHNIQUE: Multidetector CT imaging of the abdomen and pelvis was performed using the standard protocol following bolus administration of intravenous contrast. CONTRAST:  100mL OMNIPAQUE IOHEXOL 300 MG/ML  SOLN COMPARISON:  Same day abdominal ultrasound and CT abdomen pelvis dated 07/30/2014. FINDINGS: Lower chest: No acute abnormality. Hepatobiliary: The liver is hypoattenuating relative to the spleen, consistent with hepatic steatosis. No focal liver abnormality is seen. No  gallstones, gallbladder wall thickening, or biliary dilatation. Pancreas: There is diffuse edema of the pancreas with significant surrounding fat stranding and edema. The pancreas parenchyma appears to enhance homogeneously aside from a 1.3 cm area of relative hypoattenuation in the pancreatic head/uncinate (series 2, image 40). There is no acute peripancreatic fluid collection or walled off necrosis. Fluid extends into the peritoneal cavity along the bilateral pericolic gutters and into the pelvis. No pancreatic ductal dilatation. Spleen: Normal in size without focal abnormality. Adrenals/Urinary Tract: Adrenal glands are unremarkable. Kidneys are normal, without renal calculi, focal lesion, or hydronephrosis. Bladder is decompressed. Stomach/Bowel: Stomach is within normal limits. Appendix appears normal. No evidence of bowel wall thickening, distention, or inflammatory changes. Vascular/Lymphatic: No significant vascular findings are present.  No portal vein thrombosis or splenic artery aneurysm is identified. No enlarged abdominal or pelvic lymph nodes. Reproductive: An intrauterine contraceptive device is noted. Other: No abdominal wall hernia is seen. Musculoskeletal: Fixation hardware traverses the left sacroiliac joint. IMPRESSION: Acute pancreatitis. A small area of the pancreatic head/uncinate enhances less than the remainder of the pancreatic parenchyma and may represent hypoenhancement secondary only to edema versus early necrosis. No acute peripancreatic fluid collection or walled off necrosis. Electronically Signed   By: Romona Curls M.D.   On: 10/04/2020 19:15   US Abdomen Limited RUQ (LIVER/GB)  Result Date: 10/04/2020 CLINICAL DATA:  Pancreatitis and upper abdominal pain EXAM: ULTRASOUND ABDOMEN LIMITED RIGHT UPPER QUADRANT COMPARISON:  None. FINDINGS: Gallbladder: No gallstones or wall thickening visualized. No sonographic Murphy sign noted by sonographer. Common bile duct: Diameter: Not well visualized. Liver: Diffuse increased echogenicity with slightly heterogeneous liver. Appearance typically secondary to fatty infiltration. Fibrosis secondary consideration. No secondary findings of cirrhosis noted. No focal hepatic lesion or intrahepatic biliary duct dilatation. Portal vein is patent on color Doppler imaging with normal direction of blood flow towards the liver. Other: None. IMPRESSION: 1. No acute abnormality.  No evidence for cholelithiasis. 2. Hepatic steatosis. Electronically Signed   By: Katherine Mantle M.D.   On: 10/04/2020 17:23       Assessment & Plan:    Principal Problem:   Acute alcoholic pancreatitis Active Problems:   Transaminitis   Hypomagnesemia   Alcohol abuse   1. Acute alcoholic pancreatitis 1. Ultrasound abdomen shows no acute abnormality.  No evidence of cholelithiasis.  Hepatic steatosis.  No sonographic Murphy sign. 2. CT abdomen shows acute pancreatitis.  No acute peripancreatic fluid  collection or walled off necrosis.  Some hypoenhancement that could be early necrosis versus edema. 3. Lipase 1287 4. Advance diet to CLD 5. Continue maintenance fluids 6. Lipid panel pending 7. Pain scale for pain control  2. Hyperglycemia 1. Glucose 159 2. Hemoglobin A1c pending 3. Continue to monitor  3. Mild transaminitis secondary to pancreatitis, improved 1. See plan above 2. Trend with CMP in a.m.  4. Abnormal urinalysis, likely contaminated 1. UA shows nitrites, many bacteria, 11-20 epithelials, white blood cells 6-10 2. No urinary symptoms 3. Urine culture pending 4. No antibiotics started at this time  5. Leukocytosis  Lactic acidosis 1. Lactic acid increased from 1.3->2.0 with increased WBC 18->20, afebrile, does not appear septic  2. Continue plan above 3. Hold antibiotics for now 4. Trend in a.m.  6. Hypertension  Tachycardia , likely secondary to pain 1. No history of HTN 2. Continue pain management  7. Hypomagnesemia 1. Replete  8. Alcohol abuse 1. Not in withdrawal 2. CIWA protocol 3. Advised cessation  9. Anxiety/Depression 1. Continue home  Abilify, holding other home meds for now pending EKG the check QTc   DVT Prophylaxis-   Lovenox- SCDs   AM Labs Ordered, also please review Full Orders  Family Communication: Admission, patients condition and plan of care including tests being ordered have been discussed with the patient who indicate understanding and agree with the plan and Code Status.  Code Status:  Full  Admission status: Inpatient :The appropriate admission status for this patient is INPATIENT. Inpatient status is judged to be reasonable and necessary in order to provide the required intensity of service to ensure the patient's safety. The patient's presenting symptoms, physical exam findings, and initial radiographic and laboratory data in the context of their chronic comorbidities is felt to place them at high risk for further clinical  deterioration. Furthermore, it is not anticipated that the patient will be medically stable for discharge from the hospital within 2 midnights of admission. The following factors support the admission status of inpatient.     The patient's presenting symptoms include abdominal pain and nausea The worrisome physical exam findings include Abdominal tenderness The initial radiographic and laboratory data are worrisome because of Lipase almost 1300, CT shows acute pancreatitis The chronic co-morbidities include gastroparesis, asthma, chronic pain       * I certify that at the point of admission it is my clinical judgment that the patient will require inpatient hospital care spanning beyond 2 midnights from the point of admission due to high intensity of service, high risk for further deterioration and high frequency of surveillance required.*  Time spent in minutes : 60 minutes   Jae Dire DO

## 2020-10-04 NOTE — ED Notes (Signed)
Patient transported to CT 

## 2020-10-04 NOTE — ED Notes (Signed)
ED Provider at bedside. [place]

## 2020-10-04 NOTE — ED Notes (Signed)
Patient transported to Ultrasound 

## 2020-10-04 NOTE — ED Provider Notes (Signed)
[facility] EMERGENCY DEPARTMENT Provider Note   CSN: [id number] Arrival date & time: 10/04/20  1442     History Chief Complaint  Patient presents with  . Abdominal Pain    Michelle Braun is a 34 y.o. female.  Patient presents to the emergency department for evaluation of abdominal pain starting acutely about 2 days ago.  Pain is worse in the upper abdomen but she feels it all over her abdomen.  She has had associated nausea and vomiting and pain that radiates to her back.  She denies urinary symptoms with states that her urine has been dark.  No vaginal bleeding or discharge.  No chest pain or shortness of breath.  She went to an outside urgent care prior to arrival and was referred to the emergency department.  She was treated with Zofran there.  No previous history of abdominal surgeries.  She denies heavy NSAID use.  She does drink approximately 2 drinks per night with dinner.        Past Medical History:  Diagnosis Date  . Asthma   . Asthma due to seasonal allergies   . Chronic back pain   . Gastroparesis   . Migraine     Patient Active Problem List   Diagnosis Date Noted  . 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 Chronic migraine 02/23/2017    Past Surgical History:  Procedure Laterality Date  . BACK SURGERY  11/14/2016   SI Joint fusion - left side  . CYST EXCISION     Left side of head  . mole removed     Chest  . WISDOM TOOTH EXTRACTION       OB History   No obstetric history on file.     Family History  Problem Relation Age of Onset  . Migraines Mother   . Healthy Father   . Heart disease Paternal Grandfather     Social History   Tobacco Use  . Smoking status: Never Smoker  . Smokeless tobacco: Never Used  Substance Use Topics  . Alcohol use: Yes    Comment: Socially - twice weekly  . Drug use: No    Home Medications Prior to Admission medications   Medication Sig Start Date End Date Taking? Authorizing  Provider  albuterol (PROVENTIL HFA;VENTOLIN HFA) 108 (90 BASE) MCG/ACT inhaler Inhale 2 puffs into the lungs every 4 (four) hours as needed for wheezing or shortness of breath.    [provider]  aspirin EC 81 MG tablet Take 162 mg by mouth once.    [provider]  atenolol (TENORMIN) 50 MG tablet Take 50 mg by mouth daily as needed (blood pressure). Also for nervousness due to public speaking    [provider]  buPROPion (WELLBUTRIN XL) 300 MG 24 hr tablet Take 300 mg by mouth every morning.    [provider]  EPINEPHrine (EPIPEN) 0.3 mg/0.3 mL IJ SOAJ injection Inject 0.3 mg into the muscle once as needed (allergic reaction.).    [provider]  guaiFENesin (MUCINEX) 600 MG 12 hr tablet Take 600 mg by mouth 2 (two) times daily as needed for cough or to loosen phlegm.    [provider]  levonorgestrel (MIRENA) 20 MCG/24HR IUD 1 each by Intrauterine route once.    [provider]  metoCLOPramide (REGLAN) 10 MG tablet Take 10 mg by mouth See admin instructions. Thirty minutes before each meal as needed for stomach upset.    [provider]  mometasone (  NASONEX) 50 MCG/ACT nasal spray Place 2 sprays into the nose as needed (nasal congestion).     [provider]  montelukast (SINGULAIR) 10 MG tablet Take 10 mg by mouth at bedtime.    [provider]  phenylephrine (SUDAFED PE) 10 MG TABS tablet Take 10 mg by mouth every 4 (four) hours as needed (congestion).    [provider]  predniSONE (DELTASONE) 20 MG tablet Take 2 tablets (40 mg total) by mouth daily. 02/05/20   Bethel Born, PA-C  sertraline (ZOLOFT) 100 MG tablet Take 200 mg by mouth every morning.    [provider]  VYVANSE 60 MG capsule Take 60 mg by mouth every morning. 01/29/20   [provider]  rizatriptan (MAXALT-MLT) 10 MG disintegrating tablet Take 1 tablet (10 mg total) by mouth as needed. May repeat in 2 hours  if needed Patient not taking: Reported on 02/05/2020 02/23/17 02/05/20  Levert Feinstein, MD  topiramate (TOPAMAX) 50 MG tablet Take 2 tablets (100 mg total) by mouth at bedtime. Patient not taking: Reported on 02/05/2020 02/23/17 02/05/20  Levert Feinstein, MD    Allergies    Tramadol  Review of Systems   Review of Systems  Constitutional: Negative for fever.  HENT: Negative for rhinorrhea and sore throat.   Eyes: Negative for redness.  Respiratory: Negative for cough and shortness of breath.   Cardiovascular: Negative for chest pain.  Gastrointestinal: Positive for abdominal pain, nausea and vomiting. Negative for diarrhea.  Genitourinary: Negative for dysuria, frequency, hematuria and urgency.  Musculoskeletal: Positive for back pain. Negative for myalgias.  Skin: Negative for rash.  Neurological: Negative for headaches.    Physical Exam Updated Vital Signs BP (!) 143/90 (BP Location: Left Arm)   Pulse 85   Temp 97.7 F (36.5 C) (Oral)   Resp 20   Ht 6' (1.829 m)   Wt 106.6 kg   SpO2 100%   BMI 31.87 kg/m   Physical Exam Vitals and nursing note reviewed.  Constitutional:      General: She is in acute distress (appears uncomfortable).     Appearance: She is well-developed.  HENT:     Head: Normocephalic and atraumatic.     Right Ear: External ear normal.     Left Ear: External ear normal.     Nose: Nose normal.  Eyes:     Conjunctiva/sclera: Conjunctivae normal.  Cardiovascular:     Rate and Rhythm: Normal rate and regular rhythm.     Heart sounds: No murmur heard.   Pulmonary:     Effort: No respiratory distress.     Breath sounds: No wheezing, rhonchi or rales.  Abdominal:     Palpations: Abdomen is soft.     Tenderness: There is generalized abdominal tenderness. There is no guarding or rebound.  Musculoskeletal:     Cervical back: Normal range of motion and neck supple.     Right lower leg: No edema.     Left lower leg: No edema.  Skin:    General: Skin is warm and dry.      Findings: No rash.  Neurological:     General: No focal deficit present.     Mental Status: She is alert. Mental status is at baseline.     Motor: No weakness.  Psychiatric:        Mood and Affect: Mood normal.     ED Results / Procedures / Treatments   Labs (all labs ordered are listed, but only abnormal  results are displayed) Labs Reviewed  LIPASE, BLOOD - Abnormal; Notable for the following components:      Result Value   Lipase 1,287 (*)    All other components within normal limits  COMPREHENSIVE METABOLIC PANEL - Abnormal; Notable for the following components:   Glucose, Bld 159 (*)    Total Protein 8.4 (*)    AST 67 (*)    ALT 50 (*)    Total Bilirubin 1.8 (*)    All other components within normal limits  CBC - Abnormal; Notable for the following components:   WBC 18.2 (*)    RBC 5.43 (*)    Hemoglobin 17.1 (*)    HCT 51.5 (*)    All other components within normal limits  RESPIRATORY PANEL BY RT PCR (FLU A&B, COVID)  URINALYSIS, ROUTINE W REFLEX MICROSCOPIC  PREGNANCY, URINE    EKG None  Radiology No results found.  Procedures Procedures (including critical care time)  Medications Ordered in ED Medications  fentaNYL (SUBLIMAZE) injection 50 mcg (50 mcg Nasal Given 10/04/20 1502)  HYDROmorphone (DILAUDID) injection 1 mg (has no administration in time range)  ondansetron (ZOFRAN) injection 4 mg (has no administration in time range)  sodium chloride 0.9 % bolus 1,000 mL (has no administration in time range)    ED Course  I have reviewed the triage vital signs and the nursing notes.  Pertinent labs & imaging results that were available during my care of the patient were reviewed by me and considered in my medical decision making (see chart for details).   Patient seen and examined. Work-up initiated. Medications ordered.   Vital signs reviewed and are as follows: BP (!) 143/90 (BP Location: Left Arm)   Pulse 85   Temp 97.7 F (36.5 C) (Oral)    Resp 20   Ht 6' (1.829 m)   Wt 106.6 kg   SpO2 100%   BMI 31.87 kg/m   Lipase elevated. RUQ [place] ordered to eval for gallstones, biliary ductal dilation.   6:17 PM ultrasound without gallstones.  Biliary duct was not visualized.  CT ordered to evaluate pancreas.  Patient updated.  Signout to Dr. Lockie Mola at shift change, pending CT.     MDM Rules/Calculators/A&P                          Pending completion of work-up. Likely admit.    Final Clinical Impression(s) / ED Diagnoses Final diagnoses:  Pancreatitis    Rx / DC Orders ED Discharge Orders    None       Renne Crigler, PA-C 10/04/20 1818    Virgina Norfolk, DO 10/04/20 1933

## 2020-10-04 NOTE — ED Triage Notes (Addendum)
Upper abd pain with N/V x 2 days. Sent from UC. Pt had zofran at UC.

## 2020-10-05 DIAGNOSIS — Z7982 Long term (current) use of aspirin: Secondary | ICD-10-CM | POA: Diagnosis not present

## 2020-10-05 DIAGNOSIS — F1010 Alcohol abuse, uncomplicated: Secondary | ICD-10-CM

## 2020-10-05 DIAGNOSIS — Z981 Arthrodesis status: Secondary | ICD-10-CM | POA: Diagnosis not present

## 2020-10-05 DIAGNOSIS — F419 Anxiety disorder, unspecified: Secondary | ICD-10-CM | POA: Diagnosis present

## 2020-10-05 DIAGNOSIS — E872 Acidosis: Secondary | ICD-10-CM | POA: Diagnosis present

## 2020-10-05 DIAGNOSIS — R7401 Elevation of levels of liver transaminase levels: Secondary | ICD-10-CM | POA: Diagnosis not present

## 2020-10-05 DIAGNOSIS — Z6831 Body mass index (BMI) 31.0-31.9, adult: Secondary | ICD-10-CM | POA: Diagnosis not present

## 2020-10-05 DIAGNOSIS — J90 Pleural effusion, not elsewhere classified: Secondary | ICD-10-CM | POA: Diagnosis present

## 2020-10-05 DIAGNOSIS — R188 Other ascites: Secondary | ICD-10-CM | POA: Diagnosis present

## 2020-10-05 DIAGNOSIS — N390 Urinary tract infection, site not specified: Secondary | ICD-10-CM | POA: Diagnosis present

## 2020-10-05 DIAGNOSIS — I10 Essential (primary) hypertension: Secondary | ICD-10-CM | POA: Diagnosis present

## 2020-10-05 DIAGNOSIS — Z79899 Other long term (current) drug therapy: Secondary | ICD-10-CM | POA: Diagnosis not present

## 2020-10-05 DIAGNOSIS — E669 Obesity, unspecified: Secondary | ICD-10-CM | POA: Diagnosis present

## 2020-10-05 DIAGNOSIS — Z7952 Long term (current) use of systemic steroids: Secondary | ICD-10-CM | POA: Diagnosis not present

## 2020-10-05 DIAGNOSIS — Z975 Presence of (intrauterine) contraceptive device: Secondary | ICD-10-CM | POA: Diagnosis not present

## 2020-10-05 DIAGNOSIS — Z8249 Family history of ischemic heart disease and other diseases of the circulatory system: Secondary | ICD-10-CM | POA: Diagnosis not present

## 2020-10-05 DIAGNOSIS — M545 Low back pain, unspecified: Secondary | ICD-10-CM | POA: Diagnosis present

## 2020-10-05 DIAGNOSIS — K859 Acute pancreatitis without necrosis or infection, unspecified: Secondary | ICD-10-CM | POA: Diagnosis present

## 2020-10-05 DIAGNOSIS — K852 Alcohol induced acute pancreatitis without necrosis or infection: Secondary | ICD-10-CM

## 2020-10-05 DIAGNOSIS — K760 Fatty (change of) liver, not elsewhere classified: Secondary | ICD-10-CM | POA: Diagnosis present

## 2020-10-05 DIAGNOSIS — Z20822 Contact with and (suspected) exposure to covid-19: Secondary | ICD-10-CM | POA: Diagnosis present

## 2020-10-05 DIAGNOSIS — K567 Ileus, unspecified: Secondary | ICD-10-CM | POA: Diagnosis not present

## 2020-10-05 DIAGNOSIS — F32A Depression, unspecified: Secondary | ICD-10-CM | POA: Diagnosis present

## 2020-10-05 DIAGNOSIS — K863 Pseudocyst of pancreas: Secondary | ICD-10-CM | POA: Diagnosis present

## 2020-10-05 DIAGNOSIS — J9811 Atelectasis: Secondary | ICD-10-CM | POA: Diagnosis not present

## 2020-10-05 DIAGNOSIS — K8521 Alcohol induced acute pancreatitis with uninfected necrosis: Secondary | ICD-10-CM | POA: Diagnosis present

## 2020-10-05 DIAGNOSIS — F3289 Other specified depressive episodes: Secondary | ICD-10-CM | POA: Diagnosis not present

## 2020-10-05 LAB — COMPREHENSIVE METABOLIC PANEL
ALT: 37 U/L (ref 0–44)
AST: 58 U/L — ABNORMAL HIGH (ref 15–41)
Albumin: 3.9 g/dL (ref 3.5–5.0)
Alkaline Phosphatase: 69 U/L (ref 38–126)
Anion gap: 12 (ref 5–15)
BUN: 10 mg/dL (ref 6–20)
CO2: 20 mmol/L — ABNORMAL LOW (ref 22–32)
Calcium: 8.5 mg/dL — ABNORMAL LOW (ref 8.9–10.3)
Chloride: 104 mmol/L (ref 98–111)
Creatinine, Ser: 0.78 mg/dL (ref 0.44–1.00)
GFR, Estimated: >60 mL/min (ref >60)
Glucose, Bld: 123 mg/dL — ABNORMAL HIGH (ref 70–99)
Potassium: 4 mmol/L (ref 3.5–5.1)
Sodium: 136 mmol/L (ref 135–145)
Total Bilirubin: 1.8 mg/dL — ABNORMAL HIGH (ref 0.3–1.2)
Total Protein: 7.1 g/dL (ref 6.5–8.1)

## 2020-10-05 LAB — CBC WITH DIFFERENTIAL/PLATELET
Abs Immature Granulocytes: 0.15 10*3/uL — ABNORMAL HIGH (ref 0.00–0.07)
Basophils Absolute: 0.1 10*3/uL (ref 0.0–0.1)
Basophils Relative: 1 %
Eosinophils Absolute: 0 10*3/uL (ref 0.0–0.5)
Eosinophils Relative: 0 %
HCT: 50.2 % — ABNORMAL HIGH (ref 36.0–46.0)
Hemoglobin: 16.3 g/dL — ABNORMAL HIGH (ref 12.0–15.0)
Immature Granulocytes: 1 %
Lymphocytes Relative: 3 %
Lymphs Abs: 0.6 10*3/uL — ABNORMAL LOW (ref 0.7–4.0)
MCH: 31.2 pg (ref 26.0–34.0)
MCHC: 32.5 g/dL (ref 30.0–36.0)
MCV: 96 fL (ref 80.0–100.0)
Monocytes Absolute: 1 10*3/uL (ref 0.1–1.0)
Monocytes Relative: 5 %
Neutro Abs: 18.4 10*3/uL — ABNORMAL HIGH (ref 1.7–7.7)
Neutrophils Relative %: 90 %
Platelets: 183 10*3/uL (ref 150–400)
RBC: 5.23 MIL/uL — ABNORMAL HIGH (ref 3.87–5.11)
RDW: 12.9 % (ref 11.5–15.5)
WBC: 20.3 10*3/uL — ABNORMAL HIGH (ref 4.0–10.5)
nRBC: 0 % (ref 0.0–0.2)

## 2020-10-05 LAB — LIPID PANEL
Cholesterol: 270 mg/dL — ABNORMAL HIGH (ref 0–200)
HDL: 106 mg/dL (ref >40)
LDL Cholesterol: 143 mg/dL — ABNORMAL HIGH (ref 0–99)
Total CHOL/HDL Ratio: 2.5 RATIO
Triglycerides: 106 mg/dL (ref <150)
VLDL: 21 mg/dL (ref 0–40)

## 2020-10-05 LAB — LACTIC ACID, PLASMA: Lactic Acid, Venous: 2 mmol/L (ref 0.5–1.9)

## 2020-10-05 LAB — URINE CULTURE

## 2020-10-05 LAB — MAGNESIUM: Magnesium: 1.5 mg/dL — ABNORMAL LOW (ref 1.7–2.4)

## 2020-10-05 LAB — HIV ANTIBODY (ROUTINE TESTING W REFLEX): HIV Screen 4th Generation wRfx: NONREACTIVE

## 2020-10-05 MED ORDER — ONDANSETRON HCL 4 MG PO TABS: 4.0000 mg | ORAL_TABLET | Freq: Four times a day (QID) | ORAL | Status: DC | PRN

## 2020-10-05 MED ORDER — ACETAMINOPHEN 650 MG RE SUPP: 650.0000 mg | Freq: Four times a day (QID) | RECTAL | Status: DC | PRN

## 2020-10-05 MED ORDER — HYDROMORPHONE HCL 1 MG/ML IJ SOLN
0.5000 mg | INTRAMUSCULAR | Status: DC | PRN
  Administered 2020-10-05 – 2020-10-08 (×25): 1 mg via INTRAVENOUS
  Filled 2020-10-05 (×25): qty 1

## 2020-10-05 MED ORDER — HYDROMORPHONE HCL 1 MG/ML IJ SOLN
0.5000 mg | Freq: Once | INTRAMUSCULAR | Status: AC | PRN
  Administered 2020-10-05: 0.5 mg via INTRAVENOUS
  Filled 2020-10-05: qty 0.5

## 2020-10-05 MED ORDER — ACETAMINOPHEN 325 MG PO TABS
650.0000 mg | ORAL_TABLET | Freq: Four times a day (QID) | ORAL | Status: DC | PRN
  Administered 2020-10-06 – 2020-10-14 (×6): 650 mg via ORAL
  Filled 2020-10-05 (×7): qty 2

## 2020-10-05 MED ORDER — ENOXAPARIN SODIUM 40 MG/0.4ML ~~LOC~~ SOLN
40.0000 mg | SUBCUTANEOUS | Status: DC
  Administered 2020-10-05 – 2020-10-08 (×4): 40 mg via SUBCUTANEOUS
  Filled 2020-10-05 (×4): qty 0.4

## 2020-10-05 MED ORDER — ONDANSETRON HCL 4 MG/2ML IJ SOLN
4.0000 mg | Freq: Four times a day (QID) | INTRAMUSCULAR | Status: DC | PRN
  Administered 2020-10-05 – 2020-10-15 (×32): 4 mg via INTRAVENOUS
  Filled 2020-10-05 (×33): qty 2

## 2020-10-05 MED ORDER — ARIPIPRAZOLE 5 MG PO TABS
5.0000 mg | ORAL_TABLET | Freq: Every day | ORAL | Status: DC
  Administered 2020-10-05 – 2020-10-15 (×11): 5 mg via ORAL
  Filled 2020-10-05 (×11): qty 1

## 2020-10-05 MED ORDER — METOPROLOL TARTRATE 5 MG/5ML IV SOLN
5.0000 mg | Freq: Four times a day (QID) | INTRAVENOUS | Status: DC | PRN
  Administered 2020-10-12: 5 mg via INTRAVENOUS
  Filled 2020-10-05: qty 5

## 2020-10-05 MED ORDER — HYDROCODONE-ACETAMINOPHEN 5-325 MG PO TABS
1.0000 | ORAL_TABLET | ORAL | Status: DC | PRN
  Administered 2020-10-06 – 2020-10-13 (×3): 2 via ORAL
  Filled 2020-10-05 (×3): qty 2

## 2020-10-05 MED ORDER — MAGNESIUM SULFATE 4 GM/100ML IV SOLN
4.0000 g | Freq: Once | INTRAVENOUS | Status: AC
  Administered 2020-10-05: 4 g via INTRAVENOUS
  Filled 2020-10-05: qty 100

## 2020-10-05 MED ORDER — HYDROMORPHONE HCL 1 MG/ML IJ SOLN
1.0000 mg | Freq: Once | INTRAMUSCULAR | Status: AC
  Administered 2020-10-05: 1 mg via INTRAVENOUS
  Filled 2020-10-05: qty 1

## 2020-10-05 MED ORDER — POLYETHYLENE GLYCOL 3350 17 G PO PACK: 17.0000 g | PACK | Freq: Every day | ORAL | Status: DC | PRN

## 2020-10-05 NOTE — ED Notes (Signed)
Consulted Dr. Wilkie Aye regarding poor pain control, see new orders.

## 2020-10-05 NOTE — ED Notes (Signed)
Pt transported [employer] by [employer]

## 2020-10-05 NOTE — ED Notes (Signed)
Report given to Tiffany with [facility]

## 2020-10-05 NOTE — TOC Initial Note (Signed)
Transition of Care [facility]) - Initial/Assessment Note    Patient Details  Name: Michelle Braun MRN: [id number] Date of Birth: [birth year]  [facility] CM/SW Contact:    Lennart Pall, LCSW Phone Number: 10/05/2020, 3:59 PM  Clinical Narrative:                 Met with pt and significant other, Will, who pt agrees for him to be present for our discussion.  We discussed referral placed for TOC to discuss ETOH issues/ concerns given new diagnosis of alcohol pancreatitis.  Pt is aware and understands her diagnosis, however, states she did not realize the amount she was drinking was doing that much damage.  She reports that her ETOH use is directly related to need for pain control of SI joint/chronic pain issues.  She feels that cessation of the ETOH without good pain management plan could be difficult, however, realistic that "but I realize I need to do it."  Pt very receptive to information on local ETOH cessation resources and would benefit from info on local pain management practices.  Plan to follow up with her again in the morning.  Expected Discharge Plan: Home/Self Care Barriers to Discharge: Continued Medical Work up   Patient Goals and CMS Choice Patient states their goals for this hospitalization and ongoing recovery are:: to return home and hope to establish with a new pain management MD      Expected Discharge Plan and Services Expected Discharge Plan: Home/Self Care In-house Referral: Clinical Social Work     Living arrangements for the past 2 months: Apartment                                      Prior Living Arrangements/Services Living arrangements for the past 2 months: Apartment Lives with:: Significant Other Patient language and need for interpreter reviewed:: Yes Do you feel safe going back to the place where you live?: Yes      Need for Family Participation in Patient Care: No (Comment) Care giver support system in place?: Yes (comment)    Criminal Activity/Legal Involvement Pertinent to Current Situation/Hospitalization: No - Comment as needed  Activities of Daily Living Home Assistive Devices/Equipment: Contact lenses, Eyeglasses ADL Screening (condition at time of admission) Patient's cognitive ability adequate to safely complete daily activities?: Yes Is the patient deaf or have difficulty hearing?: No Does the patient have difficulty seeing, even when wearing glasses/contacts?: No Does the patient have difficulty concentrating, remembering, or making decisions?: No Patient able to express need for assistance with ADLs?: Yes Does the patient have difficulty dressing or bathing?: No Independently performs ADLs?: Yes (appropriate for developmental age) Does the patient have difficulty walking or climbing stairs?: No Weakness of Legs: None Weakness of Arms/Hands: None  Permission Sought/Granted Permission sought to share information with : Other (comment) (significant other) Permission granted to share information with : Yes, Verbal Permission Granted  Share Information with NAME: Will Hiebert     Permission granted to share info w Relationship: s/o  Permission granted to share info w Contact Information: [phone]  Emotional Assessment Appearance:: Appears stated age Attitude/Demeanor/Rapport: Engaged, Gracious Affect (typically observed): Accepting, Pleasant, Hopeful Orientation: : Oriented to Self, Oriented to Place, Oriented to  Time, Oriented to Situation Alcohol / Substance Use: Alcohol Use Psych Involvement: No (comment)  Admission diagnosis:  Acute pancreatitis [K85.90] Pancreatitis [K85.90] Alcohol-induced acute pancreatitis, unspecified complication status [P53.74] Patient  Active Problem List   Diagnosis Date Noted  . Transaminitis 10/05/2020  . Hypomagnesemia 10/05/2020  . Alcohol abuse 10/05/2020  . Acute alcoholic pancreatitis 68/37/2902  . Inflammation of sacroiliac joint ([facility]) 06/09/2017   . Chronic migraine 02/23/2017   PCP:  Patient, No Pcp Per Pharmacy:   [facility] [place], [address] AT [address] [place] [address] Phone: [phone] Fax: [phone]     Social Determinants of Health (SDOH) Interventions    Readmission Risk Interventions Readmission Risk Prevention Plan 10/05/2020  Post Dischage Appt Complete  Medication Screening Complete  Transportation Screening Complete  Some recent data might be hidden

## 2020-10-05 NOTE — ED Notes (Signed)
Dr. Wilkie Aye aware of lactic level of 2.0

## 2020-10-06 ENCOUNTER — Inpatient Hospital Stay: Payer: Self-pay

## 2020-10-06 DIAGNOSIS — F32A Depression, unspecified: Secondary | ICD-10-CM

## 2020-10-06 DIAGNOSIS — N390 Urinary tract infection, site not specified: Secondary | ICD-10-CM

## 2020-10-06 DIAGNOSIS — D72829 Elevated white blood cell count, unspecified: Secondary | ICD-10-CM

## 2020-10-06 DIAGNOSIS — F419 Anxiety disorder, unspecified: Secondary | ICD-10-CM

## 2020-10-06 DIAGNOSIS — I10 Essential (primary) hypertension: Secondary | ICD-10-CM

## 2020-10-06 LAB — LACTIC ACID, PLASMA: Lactic Acid, Venous: 1.3 mmol/L (ref 0.5–1.9)

## 2020-10-06 LAB — POTASSIUM: Potassium: 4.2 mmol/L (ref 3.5–5.1)

## 2020-10-06 LAB — CBC
HCT: 42.2 % (ref 36.0–46.0)
Hemoglobin: 13.5 g/dL (ref 12.0–15.0)
MCH: 31.7 pg (ref 26.0–34.0)
MCHC: 32 g/dL (ref 30.0–36.0)
MCV: 99.1 fL (ref 80.0–100.0)
Platelets: 156 10*3/uL (ref 150–400)
RBC: 4.26 MIL/uL (ref 3.87–5.11)
RDW: 13.2 % (ref 11.5–15.5)
WBC: 22.3 10*3/uL — ABNORMAL HIGH (ref 4.0–10.5)
nRBC: 0 % (ref 0.0–0.2)

## 2020-10-06 LAB — COMPREHENSIVE METABOLIC PANEL
ALT: 33 U/L (ref 0–44)
AST: 50 U/L — ABNORMAL HIGH (ref 15–41)
Albumin: 3.2 g/dL — ABNORMAL LOW (ref 3.5–5.0)
Alkaline Phosphatase: 67 U/L (ref 38–126)
Anion gap: 12 (ref 5–15)
BUN: 16 mg/dL (ref 6–20)
CO2: 21 mmol/L — ABNORMAL LOW (ref 22–32)
Calcium: 8.3 mg/dL — ABNORMAL LOW (ref 8.9–10.3)
Chloride: 102 mmol/L (ref 98–111)
Creatinine, Ser: 1.16 mg/dL — ABNORMAL HIGH (ref 0.44–1.00)
GFR, Estimated: >60 mL/min (ref >60)
Glucose, Bld: 131 mg/dL — ABNORMAL HIGH (ref 70–99)
Potassium: 5.7 mmol/L — ABNORMAL HIGH (ref 3.5–5.1)
Sodium: 135 mmol/L (ref 135–145)
Total Bilirubin: 1.5 mg/dL — ABNORMAL HIGH (ref 0.3–1.2)
Total Protein: 6.2 g/dL — ABNORMAL LOW (ref 6.5–8.1)

## 2020-10-06 LAB — LIPASE, BLOOD: Lipase: 379 U/L — ABNORMAL HIGH (ref 11–51)

## 2020-10-06 LAB — MAGNESIUM: Magnesium: 2.5 mg/dL — ABNORMAL HIGH (ref 1.7–2.4)

## 2020-10-06 MED ORDER — SODIUM CHLORIDE 0.9 % IV BOLUS
1000.0000 mL | Freq: Once | INTRAVENOUS | Status: AC
  Administered 2020-10-06: 1000 mL via INTRAVENOUS

## 2020-10-06 MED ORDER — CHLORHEXIDINE GLUCONATE CLOTH 2 % EX PADS
6.0000 | MEDICATED_PAD | Freq: Every day | CUTANEOUS | Status: DC
  Administered 2020-10-08 – 2020-10-15 (×8): 6 via TOPICAL

## 2020-10-06 MED ORDER — OLANZAPINE 2.5 MG PO TABS
2.5000 mg | ORAL_TABLET | Freq: Every day | ORAL | Status: DC
  Administered 2020-10-06 – 2020-10-14 (×9): 5 mg via ORAL
  Filled 2020-10-06 (×9): qty 2

## 2020-10-06 MED ORDER — GABAPENTIN 300 MG PO CAPS
600.0000 mg | ORAL_CAPSULE | Freq: Two times a day (BID) | ORAL | Status: DC
  Administered 2020-10-06 – 2020-10-15 (×18): 600 mg via ORAL
  Filled 2020-10-06 (×19): qty 2

## 2020-10-06 MED ORDER — SERTRALINE HCL 100 MG PO TABS
200.0000 mg | ORAL_TABLET | Freq: Every morning | ORAL | Status: DC
  Administered 2020-10-07 – 2020-10-15 (×9): 200 mg via ORAL
  Filled 2020-10-06 (×12): qty 2

## 2020-10-06 MED ORDER — SODIUM CHLORIDE 0.9% FLUSH
10.0000 mL | INTRAVENOUS | Status: DC | PRN
  Administered 2020-10-14: 10 mL

## 2020-10-06 MED ORDER — BUPROPION HCL ER (XL) 300 MG PO TB24
300.0000 mg | ORAL_TABLET | Freq: Every morning | ORAL | Status: DC
  Administered 2020-10-06 – 2020-10-15 (×10): 300 mg via ORAL
  Filled 2020-10-06 (×10): qty 1

## 2020-10-06 MED ORDER — LISDEXAMFETAMINE DIMESYLATE 20 MG PO CAPS
60.0000 mg | ORAL_CAPSULE | Freq: Every morning | ORAL | Status: DC
  Administered 2020-10-07 – 2020-10-10 (×4): 60 mg via ORAL
  Filled 2020-10-06 (×2): qty 3
  Filled 2020-10-06 (×2): qty 2
  Filled 2020-10-06 (×2): qty 3

## 2020-10-06 MED ORDER — SODIUM CHLORIDE 0.9 % IV SOLN
1.0000 g | Freq: Three times a day (TID) | INTRAVENOUS | Status: DC
  Administered 2020-10-06 – 2020-10-08 (×6): 1 g via INTRAVENOUS
  Filled 2020-10-06 (×7): qty 1

## 2020-10-06 NOTE — Progress Notes (Signed)
Pharmacy Antibiotic Note  Michelle Braun is a 34 y.o. female admitted on 10/04/2020 with pancreatitis.  Pharmacy has been consulted for meropenem dosing. WBC up to 22.3, SCr 1.16, CrCl ~ 94 ml/min; AF. LA 1.3  Plan: Meropenem 1 gm IV q8h F/u renal function, WBC, temp, culture data  Height: 6' (182.9 cm) Weight: 106.6 kg (235 lb) IBW/kg (Calculated) : 73.1  Temp (24hrs), Avg:98.1 F (36.7 C), Min:97.6 F (36.4 C), Max:98.3 F (36.8 C)  Recent Labs  Lab 10/04/20 1500 10/04/20 2220 10/05/20 0421 10/06/20 0326 10/06/20 0838  WBC 18.2*  --  20.3* 22.3*  --   CREATININE 0.83  --  0.78 1.16*  --   LATICACIDVEN  --  1.3 2.0*  --  1.3    Estimated Creatinine Clearance: 94.2 mL/min (A) (by C-G formula based on SCr of 1.16 mg/dL (H)).    Allergies  Allergen Reactions  . Tramadol     anaphylaxis    Antimicrobials this admission: 11/2 meropenem>>  Dose adjustments this admission:  Microbiology results: 11/2 UCx: sent 10/31 UCx: mult sp F 10/31 covid/flu neg 10/31 HIV NR  Thank you for allowing pharmacy to be a part of this patient's care.  Herby Abraham, Pharm.D 10/06/2020 1:11 PM

## 2020-10-06 NOTE — Progress Notes (Signed)
Peripherally Inserted Central Catheter Placement  The IV Nurse has discussed with the patient and/or persons authorized to consent for the patient, the purpose of this procedure and the potential benefits and risks involved with this procedure.  The benefits include less needle sticks, lab draws from the catheter, and the patient may be discharged home with the catheter. Risks include, but not limited to, infection, bleeding, blood clot (thrombus formation), and puncture of an artery; nerve damage and irregular heartbeat and possibility to perform a PICC exchange if needed/ordered by physician.  Alternatives to this procedure were also discussed.  Bard Power PICC patient education guide, fact sheet on infection prevention and patient information card has been provided to patient /or left at bedside.    PICC Placement Documentation  PICC Single Lumen 10/06/20 PICC Right Brachial 42 cm 1 cm (Active)  Indication for Insertion or Continuance of Line Limited venous access - need for IV therapy >5 days (PICC only) 10/06/20 2118  Exposed Catheter (cm) 1 cm 10/06/20 2118  Site Assessment Clean;Dry;Intact 10/06/20 2118  Line Status Blood return noted;Flushed;Saline locked 10/06/20 2118  Dressing Type Transparent;Occlusive;Securing device 10/06/20 2118  Dressing Status Clean;Dry;Intact 10/06/20 2118  Antimicrobial disc in place? Yes 10/06/20 2118  Safety Lock Not Applicable 10/06/20 2118  Line Adjustment (NICU/IV Team Only) No 10/06/20 2118  Dressing Intervention New dressing 10/06/20 2118  Dressing Change Due 10/13/20 10/06/20 2118       Christeen Douglas 10/06/2020, 9:41 PM

## 2020-10-06 NOTE — TOC Progression Note (Signed)
Transition of Care [facility]) - Progression Note    Patient Details  Name: Michelle Braun MRN: [id number] Date of Birth: [birth year]  Transition of [facility]) CM/SW Contact  Lennart Pall, LCSW Phone Number: 10/06/2020, 10:42 AM  Clinical Narrative:    Met with pt this morning to follow up on discussions yesterday and to provide written resource information.  Pt appears slightly more distressed and reports increase in pain overall. She is concerned about managing her pain both here and at home -citing again that this is the trigger for alcohol. Reviewed ETOH treatment resources as well as [facility] office which can also address pain management needs.  Encouraged her to make a plan and even call for appointments while she is here if she is able.   Will continue to follow for any additional needs.   Expected Discharge Plan: Home/Self Care Barriers to Discharge: Continued Medical Work up  Expected Discharge Plan and Services Expected Discharge Plan: Home/Self Care In-house Referral: Clinical Social Work     Living arrangements for the past 2 months: Apartment                                       Social Determinants of Health (SDOH) Interventions    Readmission Risk Interventions Readmission Risk Prevention Plan 10/05/2020  Post Dischage Appt Complete  Medication Screening Complete  Transportation Screening Complete  Some recent data might be hidden

## 2020-10-06 NOTE — Progress Notes (Addendum)
PROGRESS NOTE    Alizzon Dioguardi  HKV:[id number] DOB: 1986/03/23 DOA: 10/04/2020 PCP: Patient, No Pcp Per    Chief Complaint  Patient presents with  . Abdominal Pain    Brief Narrative:  HPI per Dr. Debria Garret is a very pleasant 34 y.o. 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 This lasted throughout the weekend and she went to an urgent care and given zofran and was directed to the ED. Admits that she drinks 1-2 shots of vodka nightly since the beginning of the pandemic. No abdominal surgeries, no heavy NSAID use. No diarrhea. States that the morphine initially helped her in the ED but now is not really helping her pain. Admits to having an appetite and willing to try to eat.   In the ED, she was afebrile, tachycardic, hypertensive and stable on room air. Notable labs: Lipase 1287, AST 67->58, ALT 50-> 37, T bili 1.8, WBC 18.2->20, Hb 17.1, Lactic acid 1.3-> 2.0, Mg 1.5 this AM. UA contaminated. RUQ [place]: hepatic steatosis, no cholelithiasis, no acute abnormality. CT abd/pelvis W contrast: acute pancreatitis with a small hypoencancement area of the pancreatic head/uncinate could be edema vs. Early necrosis without peripancreatic fluid collection or walled off necrosis.   Assessment & Plan:   Principal Problem:   Acute alcoholic pancreatitis Active Problems:   Transaminitis   Hypomagnesemia   Alcohol abuse   Depression   Anxiety   Acute lower UTI   Leukocytosis   Hypertension  #1 acute alcoholic pancreatitis Patient presented epigastric abdominal pain with radiation to the back, lipase levels elevated at 1287 on admission.  Abdominal ultrasound done with no acute abnormalities, no evidence of cholelithiasis, hepatic  steatosis.  Negative for Murphy sign.  CT abdomen and pelvis done consistent with acute pancreatitis, with small area of pancreatic head/uncinate enhances Remainder of pancreatic parenchyma and may represent hypoenhancement secondary only to edema versus early necrosis.  No acute peripancreatic fluid collection or walled off necrosis noted. Lipase levels trending down.  Patient with complaints of feeling worse today with lower abdominal pain and epigastric pain.  Leukocytosis trending up.  Patient currently afebrile.  Downgrade diet to n.p.o. except ice chips.  Placed empirically on IV Merrem.  Given 1 L fluid bolus.  Increase IV fluids to 150 cc/h.  Supportive care.  Follow.  2.??  UTI Patient with complaints of lower abdominal pain.  Denies any dysuria.  Urinalysis cloudy, nitrite positive, leukocytes negative, 6-10 WBCs, many bacteria.  Urine cultures with multiple species.  Repeat urine cultures. Patient with a leukocytosis.  Patient started empirically on IV antibiotics secondary to problem #1.  Follow.  3.  Mild transaminitis Likely secondary to problem #1.  LFTs trending down.  Abdominal ultrasound with hepatic steatosis.  Supportive care.  Follow.  4.  Leukocytosis Likely secondary to problem #1.  Patient noted to have a elevated lactic acid level.  Patient has a leukocytosis which is worsening from admission.  Patient afebrile.  Urine cultures with multiple species.  Repeat urine culture.  Check blood cultures x2. Patient states feeling worse than she did on admission.  Will place empirically on IV Merrem.  Follow.  5.  Hypertension/tachycardia Likely secondary to pain.  Patient with no history of hypertension.  Continue current pain management.  6.  Hypomagnesemia Repleted.  7.  ??  Hyperkalemia Repeat potassium levels at 4.2.  8.  Alcohol abuse Alcohol cessation stressed to patient.  Patient currently not in withdrawal.  Continue Ativan withdrawal protocol.  Follow.  9.   Depression/anxiety Continue home regimen Abilify.  EKG with no QTC prolongation.  Resume home regimen Wellbutrin, Zyprexa,vyvansse.  Outpatient follow-up.   DVT prophylaxis: Lovenox Code Status: Full Family Communication: Updated patient.  No family at bedside. Disposition:   Status is: Inpatient    Dispo: The patient is from: Home              Anticipated d/c is to: Home              Anticipated d/c date is: 3 to 4 days.              Patient currently with acute pancreatitis with significant abdominal pain, significant leukocytosis, on IV antibiotics.  Not stable for discharge.       Consultants:   None  Procedures:  CT abdomen pelvis 10/04/2020  Abdominal ultrasound 10/04/2020  Antimicrobials:   IV Merrem 10/06/2020>>>>   Subjective: Patient sitting up in bed.  States she feels worse than when she first presented to the ED.  Complains of lower abdominal pain.  Also complaining of epigastric abdominal pain.  Was able to tolerate some clears.  No chest pain.  No shortness of breath.  States they have been having difficulty getting IVs.  Objective: Vitals:   10/06/20 0103 10/06/20 0540 10/06/20 0951 10/06/20 1359  BP: 118/86 123/78 124/86 122/71  Pulse: (!) 124 (!) 126 (!) 116 94  Resp: 18 18 18 16   Temp: 98.3 F (36.8 C) 98.2 F (36.8 C) 98.3 F (36.8 C) 98 F (36.7 C)  TempSrc: Oral Oral  Oral  SpO2: 94% 95% 93% (!) 89%  Weight:      Height:        Intake/Output Summary (Last 24 hours) at 10/06/2020 1605 Last data filed at 10/06/2020 0600 Gross per 24 hour  Intake 1791.38 ml  Output --  Net 1791.38 ml   Filed Weights   10/04/20 1454  Weight: 106.6 kg    Examination:  General exam: Appears calm and comfortable  Respiratory system: Clear to auscultation. Respiratory effort normal. Cardiovascular system: S1 & S2 heard, RRR. No JVD, murmurs, rubs, gallops or clicks. No pedal edema. Gastrointestinal system: Abdomen is nondistended, soft and TTP in  epigastrium and lower abdominal region.  Positive bowel sounds. Central nervous system: Alert and oriented. No focal neurological deficits. Extremities: Symmetric 5 x 5 power. Skin: No rashes, lesions or ulcers Psychiatry: Judgement and insight appear normal. Mood & affect appropriate.     Data Reviewed: I have personally reviewed following labs and imaging studies  CBC: Recent Labs  Lab 10/04/20 1500 10/05/20 0421 10/06/20 0326  WBC 18.2* 20.3* 22.3*  NEUTROABS  --  18.4*  --   HGB 17.1* 16.3* 13.5  HCT 51.5* 50.2* 42.2  MCV 94.8 96.0 99.1  PLT 293 183 156    Basic Metabolic Panel: Recent Labs  Lab 10/04/20 1500 10/05/20 0421 10/06/20 0326 10/06/20 0953  NA 135 136 135  --   K 4.4 4.0 5.7* 4.2  CL 98 104 102  --   CO2 23 20* 21*  --   GLUCOSE 159* 123* 131*  --   BUN 11 10 16   --   CREATININE 0.83 0.78 1.16*  --   CALCIUM 9.8 8.5* 8.3*  --  MG  --  1.5*  --  2.5*    GFR: Estimated Creatinine Clearance: 94.2 mL/min (A) (by C-G formula based on SCr of 1.16 mg/dL (H)).  Liver Function Tests: Recent Labs  Lab 10/04/20 1500 10/05/20 0421 10/06/20 0326  AST 67* 58* 50*  ALT 50* 37 33  ALKPHOS 84 69 67  BILITOT 1.8* 1.8* 1.5*  PROT 8.4* 7.1 6.2*  ALBUMIN 4.7 3.9 3.2*    CBG: No results for input(s): GLUCAP in the last 168 hours.   Recent Results (from the past 240 hour(s))  Respiratory Panel by RT PCR (Flu A&B, Covid) - Nasopharyngeal Swab     Status: None   Collection Time: 10/04/20  5:28 PM   Specimen: Nasopharyngeal Swab  Result Value Ref Range Status   SARS Coronavirus 2 by RT PCR NEGATIVE NEGATIVE Final    Comment: (NOTE) SARS-CoV-2 target nucleic acids are NOT DETECTED.  The SARS-CoV-2 RNA is generally detectable in upper respiratoy specimens during the acute phase of infection. The lowest concentration of SARS-CoV-2 viral copies this assay can detect is 131 copies/mL. A negative result does not preclude SARS-Cov-2 infection and should not  be used as the sole basis for treatment or other patient management decisions. A negative result may occur with  improper specimen collection/handling, submission of specimen other than nasopharyngeal swab, presence of viral mutation(s) within the areas targeted by this assay, and inadequate number of viral copies (<131 copies/mL). A negative result must be combined with clinical observations, patient history, and epidemiological information. The expected result is Negative.  Fact Sheet for Patients:  https://www.moore.com/  Fact Sheet for Healthcare Providers:  https://www.young.biz/  This test is no t yet approved or cleared by the [place] FDA and  has been authorized for detection and/or diagnosis of SARS-CoV-2 by FDA under an Emergency Use Authorization (EUA). This EUA will remain  in effect (meaning this test can be used) for the duration of the COVID-19 declaration under Section 564(b)(1) of the Act, 21 U.S.C. section 360bbb-3(b)(1), unless the authorization is terminated or revoked sooner.     Influenza A by PCR NEGATIVE NEGATIVE Final   Influenza B by PCR NEGATIVE NEGATIVE Final    Comment: (NOTE) The Xpert Xpress SARS-CoV-2/FLU/RSV assay is intended as an aid in  the diagnosis of influenza from Nasopharyngeal swab specimens and  should not be used as a sole basis for treatment. Nasal washings and  aspirates are unacceptable for Xpert Xpress SARS-CoV-2/FLU/RSV  testing.  Fact Sheet for Patients: https://www.moore.com/  Fact Sheet for Healthcare Providers: https://www.young.biz/  This test is not yet approved or cleared by the [place] FDA and  has been authorized for detection and/or diagnosis of SARS-CoV-2 by  FDA under an Emergency Use Authorization (EUA). This EUA will remain  in effect (meaning this test can be used) for the duration of the  Covid-19 declaration under Section  564(b)(1) of the Act, 21  U.S.C. section 360bbb-3(b)(1), unless the authorization is  terminated or revoked. Performed at [facility], [address], [place], [place] [address]   Urine culture     Status: Abnormal   Collection Time: 10/04/20  6:00 PM   Specimen: Urine, Random  Result Value Ref Range Status   Specimen Description   Final    URINE, RANDOM Performed at [facility], [address] [place], [place] [address]    Special Requests   Final    NONE Performed at [facility], [address] [place], [place] [address]    Culture MULTIPLE SPECIES PRESENT, SUGGEST RECOLLECTION (A)  Final   Report Status 10/05/2020 FINAL  Final         Radiology Studies: CT ABDOMEN PELVIS W CONTRAST  Result Date: 10/04/2020 CLINICAL DATA:  Acute pancreatitis. EXAM: CT ABDOMEN AND PELVIS WITH CONTRAST TECHNIQUE: Multidetector CT imaging of the abdomen and pelvis was performed using the standard protocol following bolus administration of intravenous contrast. CONTRAST:  100mL OMNIPAQUE IOHEXOL 300 MG/ML  SOLN COMPARISON:  Same day abdominal ultrasound and CT abdomen pelvis dated 07/30/2014. FINDINGS: Lower chest: No acute abnormality. Hepatobiliary: The liver is hypoattenuating relative to the spleen, consistent with hepatic steatosis. No focal liver abnormality is seen. No gallstones, gallbladder wall thickening, or biliary dilatation. Pancreas: There is diffuse edema of the pancreas with significant surrounding fat stranding and edema. The pancreas parenchyma appears to enhance homogeneously aside from a 1.3 cm area of relative hypoattenuation in the pancreatic head/uncinate (series 2, image 40). There is no acute peripancreatic fluid collection or walled off necrosis. Fluid extends into the peritoneal cavity along the bilateral pericolic gutters and into the pelvis. No pancreatic ductal dilatation. Spleen: Normal in size without focal abnormality.  Adrenals/Urinary Tract: Adrenal glands are unremarkable. Kidneys are normal, without renal calculi, focal lesion, or hydronephrosis. Bladder is decompressed. Stomach/Bowel: Stomach is within normal limits. Appendix appears normal. No evidence of bowel wall thickening, distention, or inflammatory changes. Vascular/Lymphatic: No significant vascular findings are present. No portal vein thrombosis or splenic artery aneurysm is identified. No enlarged abdominal or pelvic lymph nodes. Reproductive: An intrauterine contraceptive device is noted. Other: No abdominal wall hernia is seen. Musculoskeletal: Fixation hardware traverses the left sacroiliac joint. IMPRESSION: Acute pancreatitis. A small area of the pancreatic head/uncinate enhances less than the remainder of the pancreatic parenchyma and may represent hypoenhancement secondary only to edema versus early necrosis. No acute peripancreatic fluid collection or walled off necrosis. Electronically Signed   By: Romona Curlsyler  Litton M.D.   On: 10/04/2020 19:15   US EKG SITE RITE  Result Date: 10/06/2020 If Site Rite image not attached, placement could not be confirmed due to current cardiac rhythm.  US Abdomen Limited RUQ (LIVER/GB)  Result Date: 10/04/2020 CLINICAL DATA:  Pancreatitis and upper abdominal pain EXAM: ULTRASOUND ABDOMEN LIMITED RIGHT UPPER QUADRANT COMPARISON:  None. FINDINGS: Gallbladder: No gallstones or wall thickening visualized. No sonographic Murphy sign noted by sonographer. Common bile duct: Diameter: Not well visualized. Liver: Diffuse increased echogenicity with slightly heterogeneous liver. Appearance typically secondary to fatty infiltration. Fibrosis secondary consideration. No secondary findings of cirrhosis noted. No focal hepatic lesion or intrahepatic biliary duct dilatation. Portal vein is patent on color Doppler imaging with normal direction of blood flow towards the liver. Other: None. IMPRESSION: 1. No acute abnormality.  No evidence  for cholelithiasis. 2. Hepatic steatosis. Electronically Signed   By: Katherine Mantlehristopher  Green M.D.   On: 10/04/2020 17:23        Scheduled Meds: . ARIPiprazole  5 mg Oral Daily  . buPROPion  300 mg Oral q morning - 10a  . enoxaparin (LOVENOX) injection  40 mg Subcutaneous Q24H  . fluticasone  1 spray Each Nare Daily  . gabapentin  600 mg Oral BID  . [START ON 10/07/2020] lisdexamfetamine  60 mg Oral q morning - 10a  . LORazepam  0-4 mg Intravenous Q6H   Followed by  . LORazepam  0-4 mg Intravenous Q12H  . OLANZapine  2.5-5 mg Oral QHS  .  thiamine  100 mg Oral Daily   Or  . thiamine  100 mg Intravenous Daily   Continuous Infusions: . sodium chloride 150 mL/hr at 10/06/20 0852  . meropenem (MERREM) IV 1 g (10/06/20 1344)     LOS: 1 day    Time spent: 40 minutes    Ramiro Harvest, MD [facility]   To contact the attending provider between 7A-7P or the covering provider during after hours 7P-7A, please log into the web site [email or web address] and access using universal [place] password for that web site. If you do not have the password, please call the hospital operator.  10/06/2020, 4:05 PM

## 2020-10-06 NOTE — Plan of Care (Signed)

## 2020-10-07 LAB — CBC WITH DIFFERENTIAL/PLATELET
Abs Immature Granulocytes: 1.16 10*3/uL — ABNORMAL HIGH (ref 0.00–0.07)
Basophils Absolute: 0.1 10*3/uL (ref 0.0–0.1)
Basophils Relative: 1 %
Eosinophils Absolute: 0.4 10*3/uL (ref 0.0–0.5)
Eosinophils Relative: 2 %
HCT: 38 % (ref 36.0–46.0)
Hemoglobin: 12 g/dL (ref 12.0–15.0)
Immature Granulocytes: 7 %
Lymphocytes Relative: 8 %
Lymphs Abs: 1.3 10*3/uL (ref 0.7–4.0)
MCH: 31.5 pg (ref 26.0–34.0)
MCHC: 31.6 g/dL (ref 30.0–36.0)
MCV: 99.7 fL (ref 80.0–100.0)
Monocytes Absolute: 1.3 10*3/uL — ABNORMAL HIGH (ref 0.1–1.0)
Monocytes Relative: 8 %
Neutro Abs: 13.3 10*3/uL — ABNORMAL HIGH (ref 1.7–7.7)
Neutrophils Relative %: 74 %
Platelets: 167 10*3/uL (ref 150–400)
RBC: 3.81 MIL/uL — ABNORMAL LOW (ref 3.87–5.11)
RDW: 13.2 % (ref 11.5–15.5)
WBC: 17.6 10*3/uL — ABNORMAL HIGH (ref 4.0–10.5)
nRBC: 0 % (ref 0.0–0.2)

## 2020-10-07 LAB — COMPREHENSIVE METABOLIC PANEL
ALT: 29 U/L (ref 0–44)
AST: 46 U/L — ABNORMAL HIGH (ref 15–41)
Albumin: 3 g/dL — ABNORMAL LOW (ref 3.5–5.0)
Alkaline Phosphatase: 78 U/L (ref 38–126)
Anion gap: 9 (ref 5–15)
BUN: 14 mg/dL (ref 6–20)
CO2: 18 mmol/L — ABNORMAL LOW (ref 22–32)
Calcium: 7.6 mg/dL — ABNORMAL LOW (ref 8.9–10.3)
Chloride: 106 mmol/L (ref 98–111)
Creatinine, Ser: 0.75 mg/dL (ref 0.44–1.00)
GFR, Estimated: >60 mL/min (ref >60)
Glucose, Bld: 97 mg/dL (ref 70–99)
Potassium: 3.9 mmol/L (ref 3.5–5.1)
Sodium: 133 mmol/L — ABNORMAL LOW (ref 135–145)
Total Bilirubin: 1.5 mg/dL — ABNORMAL HIGH (ref 0.3–1.2)
Total Protein: 5.8 g/dL — ABNORMAL LOW (ref 6.5–8.1)

## 2020-10-07 LAB — LIPASE, BLOOD: Lipase: 128 U/L — ABNORMAL HIGH (ref 11–51)

## 2020-10-07 LAB — URINE CULTURE

## 2020-10-07 NOTE — Plan of Care (Signed)

## 2020-10-07 NOTE — Progress Notes (Signed)
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 This lasted throughout the weekend and she went to an urgent care and given zofran and was directed to the ED. Admits that she drinks 1-2 shots of vodka nightly since the beginning of the pandemic. No abdominal surgeries, no heavy NSAID use. No diarrhea. States that the morphine initially helped her in the ED but now is not really helping her pain. Admits to having an appetite and willing to try to eat.   In the ED, she was afebrile, tachycardic, hypertensive and stable on room air. Notable labs: Lipase 1287, AST 67->58, ALT 50-> 37, T bili 1.8, WBC 18.2->20, Hb 17.1, Lactic acid 1.3-> 2.0, Mg 1.5 this AM. UA contaminated. RUQ [place]: hepatic steatosis, no cholelithiasis, no acute abnormality. CT abd/pelvis W contrast: acute pancreatitis with a small hypoencancement area of the pancreatic head/uncinate could be edema vs. Early necrosis without peripancreatic fluid collection or walled off necrosis.  Assessment & Plan:   Principal Problem:   Acute alcoholic pancreatitis Active Problems:   Transaminitis   Hypomagnesemia   Alcohol abuse   Depression   Anxiety   Acute lower UTI   Leukocytosis   Hypertension   #1 acute pancreatitis secondary to alcohol abuse.  Lipase levels on admission was 1287 which is down to 128 today.  She continues with lower abdominal pain.  She has been n.p.o. I will start her on clear liquids today and monitor her closely.  She has been afebrile however with leukocytosis though trending down.  #2 EtOH abuse consult  regarding alcohol cessation.  #3 depression anxiety continue Abilify.  Continue Wellbutrin and Zyprexa vyvansse  Estimated body mass index is 31.87 kg/m as calculated from the following:   Height as of this encounter: 6' (1.829 m).   Weight as of this encounter: 106.6 kg.  DVT prophylaxis: Lovenox  code Status: Family Communication:  Disposition Plan:  Status is: Inpatient  Dispo: The patient is from: Home              Anticipated d/c is to: Home              Anticipated d/c date is: 2 days              Patient currently is not medically stable to d/c.    Consultants: None  Procedures: None Antimicrobials: Anti-infectives (From admission, onward)   Start     Dose/Rate Route Frequency Ordered Stop   10/06/20 1400  meropenem (MERREM) 1 g in sodium chloride 0.9 % 100 mL IVPB        1 g 200 mL/hr over 30 Minutes Intravenous Every 8 hours 10/06/20 1253         Subjective:  Patient resting in bed continues to complain of lower abdominal pain Objective: Vitals:   10/07/20 0230 10/07/20 0730 10/07/20 0924 10/07/20 1332  BP: 118/72 (!) 110/94 112/73 (!) 145/82  Pulse: (!) 109 (!) 107 (!) 109 (!) 101  Resp: 18 16 18 18   Temp: 99 F (37.2 C) 98.1 F (36.7 C) 98 F (36.7 C) 97.6 F (36.4 C)  TempSrc: Oral Oral Oral Oral  SpO2: 94%  92% 90% 93%  Weight:      Height:        Intake/Output Summary (Last 24 hours) at 10/07/2020 1410 Last data filed at 10/07/2020 1000 Gross per 24 hour  Intake 3768.83 ml  Output 2 ml  Net 3766.83 ml   Filed Weights   10/04/20 1454  Weight: 106.6 kg    Examination:  General exam: Appears in mild distress due to pain Respiratory system: Clear to auscultation. Respiratory effort normal. Cardiovascular system: S1 & S2 heard, RRR. No JVD, murmurs, rubs, gallops or clicks. No pedal edema. Gastrointestinal system: Abdomen is distended, soft and minimal tender lower abdomen. No organomegaly or masses felt. Normal bowel sounds heard. Central  nervous system: Alert and oriented. No focal neurological deficits. Extremities: Symmetric 5 x 5 power. Skin: No rashes, lesions or ulcers Psychiatry: Judgement and insight appear normal. Mood & affect appropriate.     Data Reviewed: I have personally reviewed following labs and imaging studies  CBC: Recent Labs  Lab 10/04/20 1500 10/05/20 0421 10/06/20 0326 10/07/20 0335  WBC 18.2* 20.3* 22.3* 17.6*  NEUTROABS  --  18.4*  --  13.3*  HGB 17.1* 16.3* 13.5 12.0  HCT 51.5* 50.2* 42.2 38.0  MCV 94.8 96.0 99.1 99.7  PLT 293 183 156 167   Basic Metabolic Panel: Recent Labs  Lab 10/04/20 1500 10/05/20 0421 10/06/20 0326 10/06/20 0953 10/07/20 0335  NA 135 136 135  --  133*  K 4.4 4.0 5.7* 4.2 3.9  CL 98 104 102  --  106  CO2 23 20* 21*  --  18*  GLUCOSE 159* 123* 131*  --  97  BUN 11 10 16   --  14  CREATININE 0.83 0.78 1.16*  --  0.75  CALCIUM 9.8 8.5* 8.3*  --  7.6*  MG  --  1.5*  --  2.5*  --    GFR: Estimated Creatinine Clearance: 136.6 mL/min (by C-G formula based on SCr of 0.75 mg/dL). Liver Function Tests: Recent Labs  Lab 10/04/20 1500 10/05/20 0421 10/06/20 0326 10/07/20 0335  AST 67* 58* 50* 46*  ALT 50* 37 33 29  ALKPHOS 84 69 67 78  BILITOT 1.8* 1.8* 1.5* 1.5*  PROT 8.4* 7.1 6.2* 5.8*  ALBUMIN 4.7 3.9 3.2* 3.0*   Recent Labs  Lab 10/04/20 1500 10/06/20 0326 10/07/20 0335  LIPASE 1,287* 379* 128*   No results for input(s): AMMONIA in the last 168 hours. Coagulation Profile: No results for input(s): INR, PROTIME in the last 168 hours. Cardiac Enzymes: No results for input(s): CKTOTAL, CKMB, CKMBINDEX, TROPONINI in the last 168 hours. BNP (last 3 results) No results for input(s): PROBNP in the last 8760 hours. HbA1C: Recent Labs    10/04/20 1500  HGBA1C 4.6*   CBG: No results for input(s): GLUCAP in the last 168 hours. Lipid Profile: Recent Labs    10/04/20 2220  CHOL 270*  HDL 106  LDLCALC 143*  TRIG 106  CHOLHDL 2.5   Thyroid  Function Tests: No results for input(s): TSH, T4TOTAL, FREET4, T3FREE, THYROIDAB in the last 72 hours. Anemia Panel: No results for input(s): VITAMINB12, FOLATE, FERRITIN, TIBC, IRON, RETICCTPCT in the last 72 hours. Sepsis Labs: Recent Labs  Lab 10/04/20 2220 10/05/20 0421 10/06/20 0838  LATICACIDVEN 1.3 2.0* 1.3    Recent Results (from the past 240 hour(s))  Respiratory Panel by RT PCR (Flu A&B, Covid) - Nasopharyngeal Swab     Status: None   Collection Time: 10/04/20  5:28 PM  Specimen: Nasopharyngeal Swab  Result Value Ref Range Status   SARS Coronavirus 2 by RT PCR NEGATIVE NEGATIVE Final    Comment: (NOTE) SARS-CoV-2 target nucleic acids are NOT DETECTED.  The SARS-CoV-2 RNA is generally detectable in upper respiratoy specimens during the acute phase of infection. The lowest concentration of SARS-CoV-2 viral copies this assay can detect is 131 copies/mL. A negative result does not preclude SARS-Cov-2 infection and should not be used as the sole basis for treatment or other patient management decisions. A negative result may occur with  improper specimen collection/handling, submission of specimen other than nasopharyngeal swab, presence of viral mutation(s) within the areas targeted by this assay, and inadequate number of viral copies (<131 copies/mL). A negative result must be combined with clinical observations, patient history, and epidemiological information. The expected result is Negative.  Fact Sheet for Patients:  https://www.moore.com/  Fact Sheet for Healthcare Providers:  https://www.young.biz/  This test is no t yet approved or cleared by the [place] FDA and  has been authorized for detection and/or diagnosis of SARS-CoV-2 by FDA under an Emergency Use Authorization (EUA). This EUA will remain  in effect (meaning this test can be used) for the duration of the COVID-19 declaration under Section 564(b)(1) of the  Act, 21 U.S.C. section 360bbb-3(b)(1), unless the authorization is terminated or revoked sooner.     Influenza A by PCR NEGATIVE NEGATIVE Final   Influenza B by PCR NEGATIVE NEGATIVE Final    Comment: (NOTE) The Xpert Xpress SARS-CoV-2/FLU/RSV assay is intended as an aid in  the diagnosis of influenza from Nasopharyngeal swab specimens and  should not be used as a sole basis for treatment. Nasal washings and  aspirates are unacceptable for Xpert Xpress SARS-CoV-2/FLU/RSV  testing.  Fact Sheet for Patients: https://www.moore.com/  Fact Sheet for Healthcare Providers: https://www.young.biz/  This test is not yet approved or cleared by the [place] FDA and  has been authorized for detection and/or diagnosis of SARS-CoV-2 by  FDA under an Emergency Use Authorization (EUA). This EUA will remain  in effect (meaning this test can be used) for the duration of the  Covid-19 declaration under Section 564(b)(1) of the Act, 21  U.S.C. section 360bbb-3(b)(1), unless the authorization is  terminated or revoked. Performed at [facility] [address], [place], [place] [address]   Urine culture     Status: Abnormal   Collection Time: 10/04/20  6:00 PM   Specimen: Urine, Random  Result Value Ref Range Status   Specimen Description   Final    URINE, RANDOM Performed at [facility], [address] [place], [place] [address]    Special Requests   Final    NONE Performed at [facility], [address] [place], [place] [address]    Culture MULTIPLE SPECIES PRESENT, SUGGEST RECOLLECTION (A)  Final   Report Status 10/05/2020 FINAL  Final  Culture, Urine     Status: Abnormal   Collection Time: 10/06/20 10:32 AM   Specimen: Urine, Catheterized  Result Value Ref Range Status   Specimen Description   Final    URINE, CATHETERIZED Performed at [facility], [address], [place], [place]  [address]    Special Requests   Final    NONE Performed at [facility], [address] [place], [place] [address]    Culture MULTIPLE SPECIES PRESENT, SUGGEST RECOLLECTION (A)  Final   Report Status 10/07/2020 FINAL  Final  Culture, blood (Routine X 2) w Reflex to ID Panel     Status: None (Preliminary result)   Collection Time: 10/06/20  5:14 PM   Specimen: BLOOD  Result Value Ref Range Status   Specimen Description   Final    BLOOD RIGHT ANTECUBITAL Performed at [facility], [address] [place], [place] [address]    Special Requests   Final    BOTTLES DRAWN AEROBIC ONLY Blood Culture adequate volume Performed at [facility], [address], [place], [place] [address]    Culture   Final    NO GROWTH < 12 HOURS Performed at [facility], [address] [place], [place] [address]    Report Status PENDING  Incomplete  Culture, blood (Routine X 2) w Reflex to ID Panel     Status: None (Preliminary result)   Collection Time: 10/06/20  5:14 PM   Specimen: BLOOD  Result Value Ref Range Status   Specimen Description   Final    BLOOD LEFT ANTECUBITAL Performed at [facility], [address], [place], [place] [address]    Special Requests   Final    BOTTLES DRAWN AEROBIC ONLY Blood Culture adequate volume Performed at [facility], [address], [place], [place] [address]    Culture   Final    NO GROWTH < 12 HOURS Performed at [facility], [address], [place], [place] [address]    Report Status PENDING  Incomplete         Radiology Studies: [place] EKG SITE RITE  Result Date: 10/06/2020 If Site Rite image not attached, placement could not be confirmed due to current cardiac rhythm.       Scheduled Meds: . ARIPiprazole  5 mg Oral Daily  . buPROPion  300 mg Oral q morning - 10a  . Chlorhexidine Gluconate Cloth  6 each Topical Daily  . enoxaparin  (LOVENOX) injection  40 mg Subcutaneous Q24H  . fluticasone  1 spray Each Nare Daily  . gabapentin  600 mg Oral BID  . lisdexamfetamine  60 mg Oral q morning - 10a  . LORazepam  0-4 mg Intravenous Q12H  . OLANZapine  2.5-5 mg Oral QHS  . sertraline  200 mg Oral q morning - 10a  . thiamine  100 mg Oral Daily   Or  . thiamine  100 mg Intravenous Daily   Continuous Infusions: . sodium chloride 150 mL/hr at 10/07/20 1128  . meropenem (MERREM) IV 1 g (10/07/20 1345)     LOS: 2 days    Alwyn Ren, MD 10/07/2020, 2:10 PM

## 2020-10-08 ENCOUNTER — Inpatient Hospital Stay (HOSPITAL_COMMUNITY): Payer: BC Managed Care – PPO

## 2020-10-08 MED ORDER — GADOBUTROL 1 MMOL/ML IV SOLN
9.0000 mL | Freq: Once | INTRAVENOUS | Status: AC | PRN
  Administered 2020-10-08: 9 mL via INTRAVENOUS

## 2020-10-08 MED ORDER — IOHEXOL 9 MG/ML PO SOLN
ORAL | Status: AC
  Filled 2020-10-08: qty 1000

## 2020-10-08 MED ORDER — IOHEXOL 9 MG/ML PO SOLN: 500.0000 mL | ORAL | Status: AC

## 2020-10-08 MED ORDER — HYDROMORPHONE HCL 1 MG/ML IJ SOLN
0.5000 mg | INTRAMUSCULAR | Status: DC | PRN
  Administered 2020-10-08 – 2020-10-10 (×12): 0.5 mg via INTRAVENOUS
  Filled 2020-10-08 (×12): qty 0.5

## 2020-10-08 MED ORDER — SODIUM CHLORIDE 0.9 % IV SOLN
1.0000 g | Freq: Three times a day (TID) | INTRAVENOUS | Status: DC
  Administered 2020-10-08 – 2020-10-09 (×2): 1 g via INTRAVENOUS
  Filled 2020-10-08 (×3): qty 1

## 2020-10-08 NOTE — Progress Notes (Signed)
Patient is afebrile and clinically stable with no signs of active infection currently. Spoke with Dr. Jerolyn Center and agreed to discontinue meropenem.   Lucina Mellow (PharmD Candidate 2022)

## 2020-10-08 NOTE — Progress Notes (Signed)
Called by radiology with MR abdomen and pelvis results. 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 Clinically patient appears to be improving. WBC and liver enzymes trending down. No fever noted in past 24 hours. VS stable at this time. Discussed with general surgery. Clinically since she is improving they will discuss her case with the attending in the am. May need a GI consult as well for clinical management after discharge.   Audrea Muscat, NP [facility] 7p-7a [phone]

## 2020-10-08 NOTE — Progress Notes (Addendum)
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 This lasted throughout the weekend and she went to an urgent care and given zofran and was directed to the ED. Admits that she drinks 1-2 shots of vodka nightly since the beginning of the pandemic. No abdominal surgeries, no heavy NSAID use. No diarrhea. States that the morphine initially helped her in the ED but now is not really helping her pain. Admits to having an appetite and willing to try to eat.   In the ED, she was afebrile, tachycardic, hypertensive and stable on room air. Notable labs: Lipase 1287, AST 67->58, ALT 50-> 37, T bili 1.8, WBC 18.2->20, Hb 17.1, Lactic acid 1.3-> 2.0, Mg 1.5 this AM. UA contaminated. RUQ [place]: hepatic steatosis, no cholelithiasis, no acute abnormality. CT abd/pelvis W contrast: acute pancreatitis with a small hypoencancement area of the pancreatic head/uncinate could be edema vs. Early necrosis without peripancreatic fluid collection or walled off necrosis.  Assessment & Plan:   Principal Problem:   Acute alcoholic pancreatitis Active Problems:   Transaminitis   Hypomagnesemia   Alcohol abuse   Depression   Anxiety   Acute lower UTI   Leukocytosis   Hypertension   #1 acute pancreatitis secondary to alcohol abuse.  Lipase levels on admission was 1287 which is down to 128.  She continues with lower abdominal pain.  Patient tolerated clear liquid so far.  We will advance her to full liquid diet and then to soft diet if she tolerates.  Plan on discharge home in a.m.  DC IV Dilaudid.  DC imipenem.     Addendum-patient was very concerned about me stopping her Dilaudid.  She asked me to continue Dilaudid.  I explained to her that I cannot continue IV Dilaudid and keep her on a diet that she is tolerating at the same time.  She continues to say she has severe abdominal pain she cannot sit up the pain is on both sides of her abdomen and suprapubic area.  She has seen [facility] Dr. Ewing Schlein in the past will consult Dr. Ewing Schlein.  Will repeat CT scan of the abdomen. Will switch her back to n.p.o. and restart IV Dilaudid.  She does not have a primary care physician and I have told her to find a PCP ASAP.  She told me that when she tried to find a PCP through [facility] she was told it will take up to 6 months to find a PCP.  She was concerned that she is going to go home without prescription for Dilaudid.  #2 EtOH abuse consult regarding alcohol cessation.  #3 depression anxiety continue Abilify.  Continue Wellbutrin and Zyprexa vyvansse  Estimated body mass index is 31.87 kg/m as calculated from the following:   Height as of this encounter: 6' (1.829 m).   Weight as of this encounter: 106.6 kg.  DVT prophylaxis: Lovenox  code Status: Family Communication:  Disposition Plan:  Status is: Inpatient  Dispo: The patient is from: Home              Anticipated d/c is to: Home  Anticipated d/c date is 1 day              Patient currently is not medically stable to d/c.    Consultants: None  Procedures: None Antimicrobials: Anti-infectives (From admission, onward)   Start     Dose/Rate Route Frequency Ordered Stop   10/06/20 1400  meropenem (MERREM) 1 g in sodium chloride 0.9 % 100 mL IVPB  Status:  Discontinued        1 g 200 mL/hr over 30 Minutes Intravenous Every 8 hours 10/06/20 1253 10/08/20 1156       Subjective:  She is resting in bed continues to complain of lower abdominal pain but no real dysuria nausea vomiting reported. Objective: Vitals:   10/07/20 1332 10/07/20 1801  10/07/20 2156 10/08/20 0616  BP: (!) 145/82 134/90 136/82 129/82  Pulse: (!) 101 (!) 106 (!) 106 (!) 110  Resp: 18 18 17 16   Temp: 97.6 F (36.4 C) 98 F (36.7 C) 98 F (36.7 C) 97.7 F (36.5 C)  TempSrc: Oral Oral Oral Oral  SpO2: 93% 98% 94% (!) 89%  Weight:      Height:        Intake/Output Summary (Last 24 hours) at 10/08/2020 1240 Last data filed at 10/08/2020 1000 Gross per 24 hour  Intake 2256.99 ml  Output --  Net 2256.99 ml   Filed Weights   10/04/20 1454  Weight: 106.6 kg    Examination:  General exam: Appears in mild distress due to pain Respiratory system: Clear to auscultation. Respiratory effort normal. Cardiovascular system: S1 & S2 heard, RRR. No JVD, murmurs, rubs, gallops or clicks. No pedal edema. Gastrointestinal system: Abdomen is distended, soft and minimal tender lower abdomen. No organomegaly or masses felt. Normal bowel sounds heard. Central nervous system: Alert and oriented. No focal neurological deficits. Extremities: Symmetric 5 x 5 power. Skin: No rashes, lesions or ulcers Psychiatry: Judgement and insight appear normal. Mood & affect appropriate.     Data Reviewed: I have personally reviewed following labs and imaging studies  CBC: Recent Labs  Lab 10/04/20 1500 10/05/20 0421 10/06/20 0326 10/07/20 0335  WBC 18.2* 20.3* 22.3* 17.6*  NEUTROABS  --  18.4*  --  13.3*  HGB 17.1* 16.3* 13.5 12.0  HCT 51.5* 50.2* 42.2 38.0  MCV 94.8 96.0 99.1 99.7  PLT 293 183 156 167   Basic Metabolic Panel: Recent Labs  Lab 10/04/20 1500 10/05/20 0421 10/06/20 0326 10/06/20 0953 10/07/20 0335  NA 135 136 135  --  133*  K 4.4 4.0 5.7* 4.2 3.9  CL 98 104 102  --  106  CO2 23 20* 21*  --  18*  GLUCOSE 159* 123* 131*  --  97  BUN 11 10 16   --  14  CREATININE 0.83 0.78 1.16*  --  0.75  CALCIUM 9.8 8.5* 8.3*  --  7.6*  MG  --  1.5*  --  2.5*  --    GFR: Estimated Creatinine Clearance: 136.6 mL/min (by C-G formula based on SCr of 0.75  mg/dL). Liver Function Tests: Recent Labs  Lab 10/04/20 1500 10/05/20 0421 10/06/20 0326 10/07/20 0335  AST 67* 58* 50* 46*  ALT 50* 37 33 29  ALKPHOS 84 69 67 78  BILITOT 1.8* 1.8* 1.5* 1.5*  PROT 8.4* 7.1 6.2* 5.8*  ALBUMIN 4.7 3.9 3.2* 3.0*   Recent Labs  Lab 10/04/20 1500 10/06/20 0326 10/07/20 0335  LIPASE 1,287* 379* 128*   No results for input(s): AMMONIA  in the last 168 hours. Coagulation Profile: No results for input(s): INR, PROTIME in the last 168 hours. Cardiac Enzymes: No results for input(s): CKTOTAL, CKMB, CKMBINDEX, TROPONINI in the last 168 hours. BNP (last 3 results) No results for input(s): PROBNP in the last 8760 hours. HbA1C: No results for input(s): HGBA1C in the last 72 hours. CBG: No results for input(s): GLUCAP in the last 168 hours. Lipid Profile: No results for input(s): CHOL, HDL, LDLCALC, TRIG, CHOLHDL, LDLDIRECT in the last 72 hours. Thyroid Function Tests: No results for input(s): TSH, T4TOTAL, FREET4, T3FREE, THYROIDAB in the last 72 hours. Anemia Panel: No results for input(s): VITAMINB12, FOLATE, FERRITIN, TIBC, IRON, RETICCTPCT in the last 72 hours. Sepsis Labs: Recent Labs  Lab 10/04/20 2220 10/05/20 0421 10/06/20 0838  LATICACIDVEN 1.3 2.0* 1.3    Recent Results (from the past 240 hour(s))  Respiratory Panel by RT PCR (Flu A&B, Covid) - Nasopharyngeal Swab     Status: None   Collection Time: 10/04/20  5:28 PM   Specimen: Nasopharyngeal Swab  Result Value Ref Range Status   SARS Coronavirus 2 by RT PCR NEGATIVE NEGATIVE Final    Comment: (NOTE) SARS-CoV-2 target nucleic acids are NOT DETECTED.  The SARS-CoV-2 RNA is generally detectable in upper respiratoy specimens during the acute phase of infection. The lowest concentration of SARS-CoV-2 viral copies this assay can detect is 131 copies/mL. A negative result does not preclude SARS-Cov-2 infection and should not be used as the sole basis for treatment or other patient  management decisions. A negative result may occur with  improper specimen collection/handling, submission of specimen other than nasopharyngeal swab, presence of viral mutation(s) within the areas targeted by this assay, and inadequate number of viral copies (<131 copies/mL). A negative result must be combined with clinical observations, patient history, and epidemiological information. The expected result is Negative.  Fact Sheet for Patients:  https://www.moore.com/  Fact Sheet for Healthcare Providers:  https://www.young.biz/  This test is no t yet approved or cleared by the [place] FDA and  has been authorized for detection and/or diagnosis of SARS-CoV-2 by FDA under an Emergency Use Authorization (EUA). This EUA will remain  in effect (meaning this test can be used) for the duration of the COVID-19 declaration under Section 564(b)(1) of the Act, 21 U.S.C. section 360bbb-3(b)(1), unless the authorization is terminated or revoked sooner.     Influenza A by PCR NEGATIVE NEGATIVE Final   Influenza B by PCR NEGATIVE NEGATIVE Final    Comment: (NOTE) The Xpert Xpress SARS-CoV-2/FLU/RSV assay is intended as an aid in  the diagnosis of influenza from Nasopharyngeal swab specimens and  should not be used as a sole basis for treatment. Nasal washings and  aspirates are unacceptable for Xpert Xpress SARS-CoV-2/FLU/RSV  testing.  Fact Sheet for Patients: https://www.moore.com/  Fact Sheet for Healthcare Providers: https://www.young.biz/  This test is not yet approved or cleared by the [place] FDA and  has been authorized for detection and/or diagnosis of SARS-CoV-2 by  FDA under an Emergency Use Authorization (EUA). This EUA will remain  in effect (meaning this test can be used) for the duration of the  Covid-19 declaration under Section 564(b)(1) of the Act, 21  U.S.C. section 360bbb-3(b)(1),  unless the authorization is  terminated or revoked. Performed at [facility], [address] [place], [place] [address]   Urine culture     Status: Abnormal   Collection Time: 10/04/20  6:00 PM   Specimen: Urine, Random  Result Value Ref Range Status   Specimen Description   Final    URINE, RANDOM Performed at [facility], [address] [place], [place] [address]    Special Requests   Final    NONE Performed at [facility], [address] [place], [place] [address]    Culture MULTIPLE SPECIES PRESENT, SUGGEST RECOLLECTION (A)  Final   Report Status 10/05/2020 FINAL  Final  Culture, Urine     Status: Abnormal   Collection Time: 10/06/20 10:32 AM   Specimen: Urine, Catheterized  Result Value Ref Range Status   Specimen Description   Final    URINE, CATHETERIZED Performed at [facility], [address] [place], [place] [address]    Special Requests   Final    NONE Performed at [facility], [address] [place], [place] [address]    Culture MULTIPLE SPECIES PRESENT, SUGGEST RECOLLECTION (A)  Final   Report Status 10/07/2020 FINAL  Final  Culture, blood (Routine X 2) w Reflex to ID Panel     Status: None (Preliminary result)   Collection Time: 10/06/20  5:14 PM   Specimen: BLOOD  Result Value Ref Range Status   Specimen Description   Final    BLOOD RIGHT ANTECUBITAL Performed at [facility], [address] [place], [place] [address]    Special Requests   Final    BOTTLES DRAWN AEROBIC ONLY Blood Culture adequate volume Performed at [facility], [address] [place], [place] [address]    Culture   Final    NO GROWTH 2 DAYS Performed at [facility], [address] [place], [place] [address]    Report Status PENDING  Incomplete  Culture, blood (Routine X 2) w Reflex to ID Panel     Status: None (Preliminary result)   Collection Time:  10/06/20  5:14 PM   Specimen: BLOOD  Result Value Ref Range Status   Specimen Description   Final    BLOOD LEFT ANTECUBITAL Performed at [facility], [address] [place], [place] [address]    Special Requests   Final    BOTTLES DRAWN AEROBIC ONLY Blood Culture adequate volume Performed at [facility], [address] [place], [place] [address]    Culture   Final    NO GROWTH 2 DAYS Performed at [facility], [address] [place], [place] [address]    Report Status PENDING  Incomplete         Radiology Studies: US EKG SITE RITE  Result Date: 10/06/2020 If Site Rite image not attached, placement could not be confirmed due to current cardiac rhythm.       Scheduled Meds: . ARIPiprazole  5 mg Oral Daily  . buPROPion  300 mg Oral q morning - 10a  . Chlorhexidine Gluconate Cloth  6 each Topical Daily  . enoxaparin (LOVENOX) injection  40 mg Subcutaneous Q24H  . fluticasone  1 spray Each Nare Daily  . gabapentin  600 mg Oral BID  . lisdexamfetamine  60 mg Oral q morning - 10a  . LORazepam  0-4 mg Intravenous Q12H  . OLANZapine  2.5-5 mg Oral QHS  . sertraline  200 mg Oral q morning - 10a  . thiamine  100 mg Oral Daily   Or  . thiamine  100 mg Intravenous Daily   Continuous Infusions: . sodium chloride 100 mL/hr at 10/08/20  0600     LOS: 3 days    Alwyn Ren, MD 10/08/2020, 12:40 PM

## 2020-10-08 NOTE — Plan of Care (Signed)
  Problem: Education: Goal: Knowledge of General Education information will improve Description: Including pain rating scale, medication(s)/side effects and non-pharmacologic comfort measures Outcome: Progressing   Problem: Clinical Measurements: Goal: Will remain free from infection Outcome: Progressing   Problem: Nutrition: Goal: Adequate nutrition will be maintained Outcome: Progressing   Problem: Coping: Goal: Level of anxiety will decrease Outcome: Progressing   Problem: Pain Managment: Goal: General experience of comfort will improve Outcome: Progressing   Problem: Safety: Goal: Ability to remain free from injury will improve Outcome: Progressing   

## 2020-10-08 NOTE — Plan of Care (Signed)
  Problem: Education: Goal: Knowledge of General Education information will improve Description: Including pain rating scale, medication(s)/side effects and non-pharmacologic comfort measures Outcome: Progressing   Problem: Clinical Measurements: Goal: Will remain free from infection Outcome: Progressing   Problem: Clinical Measurements: Goal: Diagnostic test results will improve Outcome: Progressing   

## 2020-10-08 NOTE — Progress Notes (Signed)
Pharmacy Antibiotic Note  Michelle Braun is a 34 y.o. female admitted on 10/04/2020 with pancreatitis.  Pharmacy was initially consulted for meropenem dosing, then it was stopped on 11/4 without s/s active infection.  Tonight, pharmacy is consulted to resume meropenem dosing. Afebrile, WBC 17.6, SCr 0.75  Plan: Meropenem 1 gm IV q8h F/u renal function, WBC, temp, culture data  Height: 6' (182.9 cm) Weight: 106.6 kg (235 lb) IBW/kg (Calculated) : 73.1  Temp (24hrs), Avg:97.9 F (36.6 C), Min:97.7 F (36.5 C), Max:98 F (36.7 C)  Recent Labs  Lab 10/04/20 1500 10/04/20 2220 10/05/20 0421 10/06/20 0326 10/06/20 0838 10/07/20 0335  WBC 18.2*  --  20.3* 22.3*  --  17.6*  CREATININE 0.83  --  0.78 1.16*  --  0.75  LATICACIDVEN  --  1.3 2.0*  --  1.3  --     Estimated Creatinine Clearance: 136.6 mL/min (by C-G formula based on SCr of 0.75 mg/dL).    Allergies  Allergen Reactions  . Tramadol     anaphylaxis    Antimicrobials this admission: 11/2 meropenem>> 11/4, resumed 11/4 >>   Dose adjustments this admission:  Microbiology results: 11/2 UCx: ngtd 10/31 UCx: mult sp F 10/31 covid/flu neg 10/31 HIV NR  Thank you for allowing pharmacy to be a part of this patient's care.  Lynann Beaver PharmD, BCPS Clinical Pharmacist [facility] main pharmacy [phone] 10/08/2020 9:31 PM

## 2020-10-09 LAB — COMPREHENSIVE METABOLIC PANEL
ALT: 22 U/L (ref 0–44)
AST: 34 U/L (ref 15–41)
Albumin: 2.6 g/dL — ABNORMAL LOW (ref 3.5–5.0)
Alkaline Phosphatase: 124 U/L (ref 38–126)
Anion gap: 11 (ref 5–15)
BUN: <5 mg/dL — ABNORMAL LOW (ref 6–20)
CO2: 20 mmol/L — ABNORMAL LOW (ref 22–32)
Calcium: 7.6 mg/dL — ABNORMAL LOW (ref 8.9–10.3)
Chloride: 104 mmol/L (ref 98–111)
Creatinine, Ser: 0.57 mg/dL (ref 0.44–1.00)
GFR, Estimated: >60 mL/min (ref >60)
Glucose, Bld: 104 mg/dL — ABNORMAL HIGH (ref 70–99)
Potassium: 3.3 mmol/L — ABNORMAL LOW (ref 3.5–5.1)
Sodium: 135 mmol/L (ref 135–145)
Total Bilirubin: 1.1 mg/dL (ref 0.3–1.2)
Total Protein: 5.2 g/dL — ABNORMAL LOW (ref 6.5–8.1)

## 2020-10-09 LAB — CBC
HCT: 33.8 % — ABNORMAL LOW (ref 36.0–46.0)
Hemoglobin: 10.8 g/dL — ABNORMAL LOW (ref 12.0–15.0)
MCH: 31 pg (ref 26.0–34.0)
MCHC: 32 g/dL (ref 30.0–36.0)
MCV: 97.1 fL (ref 80.0–100.0)
Platelets: 141 10*3/uL — ABNORMAL LOW (ref 150–400)
RBC: 3.48 MIL/uL — ABNORMAL LOW (ref 3.87–5.11)
RDW: 13.7 % (ref 11.5–15.5)
WBC: 16.6 10*3/uL — ABNORMAL HIGH (ref 4.0–10.5)
nRBC: 0 % (ref 0.0–0.2)

## 2020-10-09 LAB — LIPASE, BLOOD: Lipase: 38 U/L (ref 11–51)

## 2020-10-09 MED ORDER — POLYETHYLENE GLYCOL 3350 17 G PO PACK
17.0000 g | PACK | Freq: Every day | ORAL | Status: DC
  Administered 2020-10-14: 17 g via ORAL
  Filled 2020-10-09 (×2): qty 1

## 2020-10-09 MED ORDER — LORAZEPAM 2 MG/ML IJ SOLN
0.5000 mg | Freq: Four times a day (QID) | INTRAMUSCULAR | Status: DC | PRN
  Administered 2020-10-09 – 2020-10-15 (×23): 0.5 mg via INTRAVENOUS
  Filled 2020-10-09 (×23): qty 1

## 2020-10-09 NOTE — Consult Note (Signed)
Referring Provider: [facility] Primary Care Physician:  Patient, No Pcp Per Primary Gastroenterologist:  Dr. Dulce Sellar  Reason for Consultation: Necrotizing pancreatitis  HPI: Michelle Braun is a 34 y.o. female with past medical history of chronic SI joint pain, history of alcohol use, history of gastroparesis, history of anxiety and depression admitted to the hospital on October 05, 2020 for further evaluation of nausea vomiting and abdominal pain.  Upon initial evaluation, she was found to have mild elevated LFTs, elevated lipase of 1287 and mild leukocytosis.  CT abdomen pelvis with contrast showed acute pancreatitis with possible early pancreatic necrosis.  Because of ongoing abdominal pain, MRI MRCP was ordered yesterday which showed changes of severe pancreatitis with pancreatic necrosis involving more than 50% of the pancreas, mainly in the body and tail and also part of the head of the pancreas.  It also showed hemorrhagic peripancreatic fluid collection as well as near occlusion of the splenic vein.  No biliary ductal dilation or choledocholithiasis.  Patient seen and examined at bedside.  Patient reports daily alcohol use for pain control.  She started having worsening abdominal pain, nausea and vomiting last Friday.  Denied similar episode in the past.  She continues to have abdominal pain which is located in epigastric as well as central abdominal area.  Last episode of vomiting was yesterday.  No bowel movement since admission.  Denies any blood in the stool or black stool.  Past Medical History:  Diagnosis Date  . Asthma   . Asthma due to seasonal allergies   . Chronic back pain   . Gastroparesis   . Migraine     Past Surgical History:  Procedure Laterality Date  . BACK SURGERY  11/14/2016   SI Joint fusion - left side  . CYST EXCISION     Left side of head  . mole removed     Chest  . WISDOM TOOTH EXTRACTION      Prior to Admission medications   Medication Sig Start Date End  Date Taking? Authorizing Provider  ARIPiprazole (ABILIFY) 5 MG tablet Take 5 mg by mouth daily. 07/06/20  Yes [provider]  buPROPion (WELLBUTRIN XL) 300 MG 24 hr tablet Take 300 mg by mouth every morning.   Yes [provider]  EPINEPHrine (EPIPEN) 0.3 mg/0.3 mL IJ SOAJ injection Inject 0.3 mg into the muscle once as needed (allergic reaction.).   Yes [provider]  gabapentin (NEURONTIN) 600 MG tablet Take 600 mg by mouth in the morning and at bedtime. 07/06/20  Yes [provider]  guaiFENesin (MUCINEX) 600 MG 12 hr tablet Take 600 mg by mouth 2 (two) times daily as needed for cough or to loosen phlegm.   Yes [provider]  levonorgestrel (MIRENA) 20 MCG/24HR IUD 1 each by Intrauterine route once.   Yes [provider]  metoCLOPramide (REGLAN) 10 MG tablet Take 10 mg by mouth See admin instructions. Thirty minutes before each meal as needed for stomach upset.   Yes [provider]  mometasone (NASONEX) 50 MCG/ACT nasal spray Place 2 sprays into the nose as needed (nasal congestion).    Yes [provider]  montelukast (SINGULAIR) 10 MG tablet Take 10 mg by mouth at bedtime.   Yes [provider]  phenylephrine (SUDAFED PE) 10 MG TABS tablet Take 10 mg by mouth every 4 (four) hours as needed (congestion).   Yes [provider]  sertraline (ZOLOFT) 100 MG tablet Take 200 mg by mouth every morning.  Yes [provider]  VYVANSE 60 MG capsule Take 60 mg by mouth every morning. 01/29/20  Yes [provider]  albuterol (PROVENTIL HFA;VENTOLIN HFA) 108 (90 BASE) MCG/ACT inhaler Inhale 2 puffs into the lungs every 4 (four) hours as needed for wheezing or shortness of breath.    [provider]  aspirin EC 81 MG tablet Take 162 mg by mouth once. Patient not taking: Reported on 10/05/2020    [provider]  atenolol (TENORMIN) 50 MG tablet Take 50 mg by mouth daily as needed  (blood pressure). Also for nervousness due to public speaking Patient not taking: Reported on 10/05/2020    [provider]  OLANZapine (ZYPREXA) 5 MG tablet Take 2.5-5 mg by mouth at bedtime. 09/28/20   [provider]  predniSONE (DELTASONE) 20 MG tablet Take 2 tablets (40 mg total) by mouth daily. Patient not taking: Reported on 10/05/2020 02/05/20   Bethel Born, PA-C  rizatriptan (MAXALT-MLT) 10 MG disintegrating tablet Take 1 tablet (10 mg total) by mouth as needed. May repeat in 2 hours if needed Patient not taking: Reported on 02/05/2020 02/23/17 02/05/20  Levert Feinstein, MD  topiramate (TOPAMAX) 50 MG tablet Take 2 tablets (100 mg total) by mouth at bedtime. Patient not taking: Reported on 02/05/2020 02/23/17 02/05/20  Levert Feinstein, MD    Scheduled Meds: . ARIPiprazole  5 mg Oral Daily  . buPROPion  300 mg Oral q morning - 10a  . Chlorhexidine Gluconate Cloth  6 each Topical Daily  . enoxaparin (LOVENOX) injection  40 mg Subcutaneous Q24H  . fluticasone  1 spray Each Nare Daily  . gabapentin  600 mg Oral BID  . lisdexamfetamine  60 mg Oral q morning - 10a  . OLANZapine  2.5-5 mg Oral QHS  . sertraline  200 mg Oral q morning - 10a  . thiamine  100 mg Oral Daily   Or  . thiamine  100 mg Intravenous Daily   Continuous Infusions: . sodium chloride 75 mL/hr at 10/09/20 0151  . meropenem (MERREM) IV 1 g (10/09/20 0516)   PRN Meds:.acetaminophen **OR** acetaminophen, albuterol, HYDROcodone-acetaminophen, HYDROmorphone (DILAUDID) injection, metoprolol tartrate, ondansetron **OR** ondansetron (ZOFRAN) IV, polyethylene glycol, sodium chloride flush  Allergies as of 10/04/2020 - Review Complete 10/04/2020  Allergen Reaction Noted  . Tramadol  07/30/2014    Family History  Problem Relation Age of Onset  . Migraines Mother   . Healthy Father   . Heart disease Paternal Grandfather     Social History   Socioeconomic History  . Marital status: Single    Spouse name: Not on  file  . Number of children: 0  . Years of education: IT consultant  . Highest education level: Not on file  Occupational History  . Occupation: IT consultant  Tobacco Use  . Smoking status: Never Smoker  . Smokeless tobacco: Never Used  Substance and Sexual Activity  . Alcohol use: Yes    Comment: Socially - twice weekly  . Drug use: No  . Sexual activity: Not on file  Other Topics Concern  . Not on file  Social History Narrative   Lives at home alone.   Right-handed.   4 cups caffeine per day.   Social Determinants of Health   Financial Resource Strain:   . Difficulty of Paying Living Expenses: Not on file  Food Insecurity:   . Worried About Programme researcher, broadcasting/film/video in the Last Year: Not on file  . Ran Out of Food in  the Last Year: Not on file  Transportation Needs:   . Lack of Transportation (Medical): Not on file  . Lack of Transportation (Non-Medical): Not on file  Physical Activity:   . Days of Exercise per Week: Not on file  . Minutes of Exercise per Session: Not on file  Stress:   . Feeling of Stress : Not on file  Social Connections:   . Frequency of Communication with Friends and Family: Not on file  . Frequency of Social Gatherings with Friends and Family: Not on file  . Attends Religious Services: Not on file  . Active Member of Clubs or Organizations: Not on file  . Attends BankerClub or Organization Meetings: Not on file  . Marital Status: Not on file  Intimate Partner Violence:   . Fear of Current or Ex-Partner: Not on file  . Emotionally Abused: Not on file  . Physically Abused: Not on file  . Sexually Abused: Not on file    Review of Systems: Review of Systems  Constitutional: Negative for chills and fever.  HENT: Negative for hearing loss and tinnitus.   Eyes: Negative for blurred vision and double vision.  Respiratory: Negative for cough and hemoptysis.   Cardiovascular: Negative for chest pain and palpitations.  Gastrointestinal: Positive for abdominal  pain, constipation, heartburn, nausea and vomiting. Negative for blood in stool and melena.  Genitourinary: Negative for dysuria and urgency.  Musculoskeletal: Positive for back pain and joint pain.  Skin: Negative for itching and rash.  Neurological: Negative for seizures and loss of consciousness.  Endo/Heme/Allergies: Does not bruise/bleed easily.  Psychiatric/Behavioral: Positive for substance abuse. The patient is nervous/anxious.     Physical Exam: Vital signs: Vitals:   10/08/20 2215 10/09/20 0521  BP: 121/88 (!) 131/91  Pulse: (!) 110 (!) 107  Resp: 18 17  Temp: 99.3 F (37.4 C) 98.9 F (37.2 C)  SpO2: 99% 97%   Last BM Date: 10/08/20 Physical Exam Vitals and nursing note reviewed.  Constitutional:      General: She is not in acute distress.    Appearance: She is well-developed.  HENT:     Head: Normocephalic and atraumatic.     Mouth/Throat:     Mouth: Mucous membranes are moist.     Pharynx: No oropharyngeal exudate.  Eyes:     General: No scleral icterus.    Extraocular Movements: Extraocular movements intact.  Cardiovascular:     Rate and Rhythm: Normal rate and regular rhythm.     Heart sounds: Normal heart sounds. No murmur heard.   Pulmonary:     Effort: Pulmonary effort is normal. No respiratory distress.     Breath sounds: Rales present.  Abdominal:     Hernia: No hernia is present.     Comments: Epigastric and left upper quadrant tenderness to palpation, abdomen is mildly distended, bowel sounds present.  No peritoneal signs  Skin:    General: Skin is warm.     Coloration: Skin is not jaundiced.  Neurological:     Mental Status: She is alert and oriented to person, place, and time.  Psychiatric:        Mood and Affect: Mood is anxious.        Behavior: Behavior normal.     GI:  Lab Results: Recent Labs    10/07/20 0335 10/09/20 0500  WBC 17.6* 16.6*  HGB 12.0 10.8*  HCT 38.0 33.8*  PLT 167 141*   BMET Recent Labs    10/06/20 0953  10/07/20  0335 10/09/20 0500  NA  --  133* 135  K 4.2 3.9 3.3*  CL  --  106 104  CO2  --  18* 20*  GLUCOSE  --  97 104*  BUN  --  14 <5*  CREATININE  --  0.75 0.57  CALCIUM  --  7.6* 7.6*   LFT Recent Labs    10/09/20 0500  PROT 5.2*  ALBUMIN 2.6*  AST 34  ALT 22  ALKPHOS 124  BILITOT 1.1   PT/INR No results for input(s): LABPROT, INR in the last 72 hours.   Studies/Results: MR 3D Recon At Scanner  Result Date: 10/08/2020 CLINICAL DATA:  34 year old female with reported history of pancreatitis, found at pancreatitis on recent CT evaluation. EXAM: MRI ABDOMEN WITHOUT AND WITH CONTRAST (INCLUDING MRCP) TECHNIQUE: Multiplanar multisequence MR imaging of the abdomen was performed both before and after the administration of intravenous contrast. Heavily T2-weighted images of the biliary and pancreatic ducts were obtained, and three-dimensional MRCP images were rendered by post processing. CONTRAST:  76mL GADAVIST GADOBUTROL 1 MMOL/ML IV SOLN COMPARISON:  CT study from 10/04/2020 FINDINGS: Lower chest: Interval development of bilateral pleural effusions, trace on the RIGHT and small on the LEFT with associated basilar airspace disease. Airspace process not well evaluated on MRI. Hepatobiliary: Severe hepatic steatosis. No focal, suspicious hepatic lesion. Gallbladder moderately distended. No pericholecystic stranding. No biliary duct dilation. No filling defect in the common bile duct. 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 Interval development of acute pancreatic collections with hemorrhagic and/or proteinaceous features adjacent to the head and neck of the pancreas extending into the hepatic gastric recess of the lesser sac measuring 7.9 x 5.3 cm. Smaller amounts of fluid track into the central mesentery. Also with acute pancreatic collections adjacent to the tail tracking towards the LEFT subdiaphragmatic region between thew spleen and  the stomach above the pancreatic tail. Scattered areas of acute pancreatic fluid in the anterior pararenal space on the LEFT. Small volume ascites. Some mass effect upon the splenic portal confluence. Highly narrowed splenic vein. Patent portal vein and SMV. Spleen: Spleen without focal lesion with mild-to-moderate enlargement approximately 14 cm greatest craniocaudal dimension. Adrenals/Urinary Tract: Adrenal glands are normal. Symmetric renal enhancement. No sign of hydronephrosis no suspicious renal lesion. Stomach/Bowel: Bowel edema and gastric edema in the setting of severe pancreatitis. 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 Other:  Small volume ascites and body wall edema. Musculoskeletal: No suspicious bone lesions identified. IMPRESSION: 1. 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 Also displaying marked worsening of stigmata of pancreatitis with peripancreatic collections some with hemorrhagic or proteinaceous features. 2. 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 3. Interval development of bilateral pleural effusions, trace on the RIGHT and small on the LEFT with associated basilar airspace disease. 4. Edema of the stomach and adjacent small bowel. 5. No findings of choledocholithiasis.  No biliary duct dilation. 6. Small volume ascites and body wall edema. 7. Severe hepatic steatosis. These results will be called to the ordering clinician or representative by the Radiologist Assistant, and communication documented in the PACS or [employer]. Electronically Signed  By: Donzetta Kohut M.D.   On: 10/08/2020 20:56   MR ABDOMEN  MRCP W WO CONTAST  Result Date: 10/08/2020 CLINICAL DATA:  34 year old female with reported history of pancreatitis, found at pancreatitis on recent CT evaluation. EXAM: MRI ABDOMEN WITHOUT AND WITH CONTRAST (INCLUDING MRCP) TECHNIQUE: Multiplanar multisequence MR imaging of the abdomen was performed both before and after the administration of intravenous contrast. Heavily T2-weighted images of the biliary and pancreatic ducts were obtained, and three-dimensional MRCP images were rendered by post processing. CONTRAST:  9mL GADAVIST GADOBUTROL 1 MMOL/ML IV SOLN COMPARISON:  CT study from 10/04/2020 FINDINGS: Lower chest: Interval development of bilateral pleural effusions, trace on the RIGHT and small on the LEFT with associated basilar airspace disease. Airspace process not well evaluated on MRI. Hepatobiliary: Severe hepatic steatosis. No focal, suspicious hepatic lesion. Gallbladder moderately distended. No pericholecystic stranding. No biliary duct dilation. No filling defect in the common bile duct. 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 Interval development of acute pancreatic collections with hemorrhagic and/or proteinaceous features adjacent to the head and neck of the pancreas extending into the hepatic gastric recess of the lesser sac measuring 7.9 x 5.3 cm. Smaller amounts of fluid track into the central mesentery. Also with acute pancreatic collections adjacent to the tail tracking towards the LEFT subdiaphragmatic region between thew spleen and the stomach above the pancreatic tail. Scattered areas of acute pancreatic fluid in the anterior pararenal space on the LEFT. Small volume ascites. Some mass effect upon the splenic portal confluence. Highly narrowed splenic vein. Patent portal vein and SMV. Spleen: Spleen without focal lesion with mild-to-moderate enlargement approximately 14 cm greatest craniocaudal dimension. Adrenals/Urinary Tract:  Adrenal glands are normal. Symmetric renal enhancement. No sign of hydronephrosis no suspicious renal lesion. Stomach/Bowel: Bowel edema and gastric edema in the setting of severe pancreatitis. 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 Other:  Small volume ascites and body wall edema. Musculoskeletal: No suspicious bone lesions identified. IMPRESSION: 1. 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 Also displaying marked worsening of stigmata of pancreatitis with peripancreatic collections some with hemorrhagic or proteinaceous features. 2. 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 3. Interval development of bilateral pleural effusions, trace on the RIGHT and small on the LEFT with associated basilar airspace disease. 4. Edema of the stomach and adjacent small bowel. 5. No findings of choledocholithiasis.  No biliary duct dilation. 6. Small volume ascites and body wall edema. 7. Severe hepatic steatosis. These results will be called to the ordering clinician or representative by the Radiologist Assistant, and communication documented in the PACS or [employer]. Electronically Signed   By: Donzetta Kohut M.D.   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 - Peripancreatic hemorrhagic collection.  Most likely developing early pseudocyst - Narrowing of the splenic vein.  Recent CT scan was negative for any thrombosis. -Constipation.  No bowel movement since admission. Recommendations ------------------------- -Continue supportive care for now.  Patient's BUN and HCT has trended  down. -Increase IV hydration to 150 cc for next 24 hours -Okay to have full  liquid diet from GI standpoint -No need for antibiotics from GI standpoint -Start daily MiraLAX.  Hold for diarrhea. -May need a cyst gastrostomy and pancreatic necrosectomy down the road -Absolute alcohol abstinence discussed with the patient. -GI will follow    LOS: 4 days   Kathi Der  MD, FACP 10/09/2020, 9:35 AM  Contact #  [phone]

## 2020-10-09 NOTE — TOC Progression Note (Signed)
Transition of Care [facility]) - Progression Note    Patient Details  Name: Salome Hautala MRN: [id number] Date of Birth: [birth year]  Transition of [facility] CM/SW Contact  Amada Jupiter, LCSW Phone Number: 10/09/2020, 11:08 AM  Clinical Narrative:    Following up with pt this morning who has received medical update of confirmed necrotizing pancreatitis and "it's worse than I thought".  She is still concerned about her pain management plan moving forward.  Confirms she has established a PCP and has appointment on 11/15.  Her plan is to discuss ongoing pain management with this new MD and I have provided her additional SA resource info as well. Pt denies any further needs at this time.  I will continue to be available as needed.  Expected Discharge Plan: Home/Self Care Barriers to Discharge: Continued Medical Work up  Expected Discharge Plan and Services Expected Discharge Plan: Home/Self Care In-house Referral: Clinical Social Work     Living arrangements for the past 2 months: Apartment                                       Social Determinants of Health (SDOH) Interventions    Readmission Risk Interventions Readmission Risk Prevention Plan 10/05/2020  Post Dischage Appt Complete  Medication Screening Complete  Transportation Screening Complete  Some recent data might be hidden

## 2020-10-09 NOTE — Plan of Care (Signed)
  Problem: Education: Goal: Knowledge of General Education information will improve Description: Including pain rating scale, medication(s)/side effects and non-pharmacologic comfort measures Outcome: Progressing   Problem: Clinical Measurements: Goal: Diagnostic test results will improve Outcome: Progressing   Problem: Activity: Goal: Risk for activity intolerance will decrease Outcome: Progressing   

## 2020-10-09 NOTE — Progress Notes (Signed)
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 This lasted throughout the weekend and she went to an urgent care and given zofran and was directed to the ED. Admits that she drinks 1-2 shots of vodka nightly since the beginning of the pandemic. No abdominal surgeries, no heavy NSAID use. No diarrhea. States that the morphine initially helped her in the ED but now is not really helping her pain. Admits to having an appetite and willing to try to eat.   In the ED, she was afebrile, tachycardic, hypertensive and stable on room air. Notable labs: Lipase 1287, AST 67->58, ALT 50-> 37, T bili 1.8, WBC 18.2->20, Hb 17.1, Lactic acid 1.3-> 2.0, Mg 1.5 this AM. UA contaminated. RUQ [place]: hepatic steatosis, no cholelithiasis, no acute abnormality. CT abd/pelvis W contrast: acute pancreatitis with a small hypoencancement area of the pancreatic head/uncinate could be edema vs. Early necrosis without peripancreatic fluid collection or walled off necrosis.  Assessment & Plan:   Principal Problem:   Acute alcoholic pancreatitis Active Problems:   Transaminitis   Hypomagnesemia   Alcohol abuse   Depression   Anxiety   Acute lower UTI   Leukocytosis   Hypertension   #1 acute pancreatitis secondary to alcohol abuse.  Lipase levels on admission was 1287 which is down to 38. She continues with lower abdominal pain.  MRI/MRCP 10/08/2020 findings concerning for necrotizing pancreatitis and early pseudocyst. Patient has been on IV fluids normal saline at 150 cc an hour since admission.  Will advance  her diet to full liquid diet.  Appreciate GI input. Encouraged her to get out of bed ambulate. Her white count remains elevated at 16 throughout.  DC imipenem.  #2 EtOH abuse consult regarding alcohol cessation.  #3 depression anxiety continue Abilify.  Continue Wellbutrin and Zyprexa vyvansse  #4 hypokalemia potassium is 3.3 replete potassium.  Check magnesium.  Estimated body mass index is 31.87 kg/m as calculated from the following:   Height as of this encounter: 6' (1.829 m).   Weight as of this encounter: 106.6 kg.  DVT prophylaxis: Lovenox  code Status: Family Communication:  Disposition Plan:  Status is: Inpatient  Dispo: The patient is from: Home              Anticipated d/c is to: Home              Anticipated d/c date is 1 day              Patient currently is not medically stable to d/c.    Consultants: None  Procedures: None Antimicrobials: Anti-infectives (From admission, onward)   Start     Dose/Rate Route Frequency Ordered Stop   10/08/20 2200  meropenem (MERREM) 1 g in sodium chloride 0.9 % 100 mL IVPB  Status:  Discontinued        1 g 200 mL/hr over 30 Minutes Intravenous Every 8 hours 10/08/20 2134 10/09/20 1110   10/06/20 1400  meropenem (MERREM) 1 g in sodium chloride 0.9 % 100 mL IVPB  Status:  Discontinued        1 g 200 mL/hr over 30 Minutes Intravenous Every 8 hours  10/06/20 1253 10/08/20 1156       Subjective:  Patient resting in bed, continues to complain of severe abdominal pain and anxiety.  She is going to find a PCP for herself today.  She denies having any bowel movements nausea vomiting. She reports severe anxiety and tremors from not drinking alcohol. Objective: Vitals:   10/08/20 0616 10/08/20 1317 10/08/20 2215 10/09/20 0521  BP: 129/82 137/88 121/88 (!) 131/91  Pulse: (!) 110 (!) 108 (!) 110 (!) 107  Resp: Temp: 97.7 F (36.5 C) 98 F (36.7 C) 99.3 F (37.4 C) 98.9 F (37.2 C)  TempSrc: Oral Oral Oral Oral   SpO2: (!) 89% 98% 99% 97%  Weight:      Height:        Intake/Output Summary (Last 24 hours) at 10/09/2020 1121 Last data filed at 10/09/2020 0930 Gross per 24 hour  Intake 1293.3 ml  Output --  Net 1293.3 ml   Filed Weights   10/04/20 1454  Weight: 106.6 kg    Examination:  General exam: Appears in mild distress due to pain Respiratory system: Clear to auscultation. Respiratory effort normal. Cardiovascular system: S1 & S2 heard, RRR. No JVD, murmurs, rubs, gallops or clicks. No pedal edema. Gastrointestinal system: Abdomen is distended, soft and minimal tender lower abdomen. No organomegaly or masses felt. Normal bowel sounds heard. Central nervous system: Alert and oriented. No focal neurological deficits. Extremities: Symmetric 5 x 5 power. Skin: No rashes, lesions or ulcers Psychiatry: Judgement and insight appear normal. Mood & affect appropriate.     Data Reviewed: I have personally reviewed following labs and imaging studies  CBC: Recent Labs  Lab 10/04/20 1500 10/05/20 0421 10/06/20 0326 10/07/20 0335 10/09/20 0500  WBC 18.2* 20.3* 22.3* 17.6* 16.6*  NEUTROABS  --  18.4*  --  13.3*  --   HGB 17.1* 16.3* 13.5 12.0 10.8*  HCT 51.5* 50.2* 42.2 38.0 33.8*  MCV 94.8 96.0 99.1 99.7 97.1  PLT 293 183 156 167 141*   Basic Metabolic Panel: Recent Labs  Lab 10/04/20 1500 10/04/20 1500 10/05/20 0421 10/06/20 0326 10/06/20 0953 10/07/20 0335 10/09/20 0500  NA 135  --  136 135  --  133* 135  K 4.4   < > 4.0 5.7* 4.2 3.9 3.3*  CL 98  --  104 102  --  106 104  CO2 23  --  20* 21*  --  18* 20*  GLUCOSE 159*  --  123* 131*  --  97 104*  BUN 11  --  10 16  --  14 <5*  CREATININE 0.83  --  0.78 1.16*  --  0.75 0.57  CALCIUM 9.8  --  8.5* 8.3*  --  7.6* 7.6*  MG  --   --  1.5*  --  2.5*  --   --    < > = values in this interval not displayed.   GFR: Estimated Creatinine Clearance: 136.6 mL/min (by C-G formula based on SCr of 0.57 mg/dL). Liver Function  Tests: Recent Labs  Lab 10/04/20 1500 10/05/20 0421 10/06/20 0326 10/07/20 0335 10/09/20 0500  AST 67* 58* 50* 46* 34  ALT 50* 37 33 29 22  ALKPHOS 84 69 67 78 124  BILITOT 1.8* 1.8* 1.5* 1.5* 1.1  PROT 8.4* 7.1 6.2* 5.8* 5.2*  ALBUMIN 4.7 3.9 3.2* 3.0* 2.6*   Recent Labs  Lab 10/04/20 1500 10/06/20 0326 10/07/20 0335 10/09/20 0500  LIPASE 1,287* 379* 128*  38   No results for input(s): AMMONIA in the last 168 hours. Coagulation Profile: No results for input(s): INR, PROTIME in the last 168 hours. Cardiac Enzymes: No results for input(s): CKTOTAL, CKMB, CKMBINDEX, TROPONINI in the last 168 hours. BNP (last 3 results) No results for input(s): PROBNP in the last 8760 hours. HbA1C: No results for input(s): HGBA1C in the last 72 hours. CBG: No results for input(s): GLUCAP in the last 168 hours. Lipid Profile: No results for input(s): CHOL, HDL, LDLCALC, TRIG, CHOLHDL, LDLDIRECT in the last 72 hours. Thyroid Function Tests: No results for input(s): TSH, T4TOTAL, FREET4, T3FREE, THYROIDAB in the last 72 hours. Anemia Panel: No results for input(s): VITAMINB12, FOLATE, FERRITIN, TIBC, IRON, RETICCTPCT in the last 72 hours. Sepsis Labs: Recent Labs  Lab 10/04/20 2220 10/05/20 0421 10/06/20 0838  LATICACIDVEN 1.3 2.0* 1.3    Recent Results (from the past 240 hour(s))  Respiratory Panel by RT PCR (Flu A&B, Covid) - Nasopharyngeal Swab     Status: None   Collection Time: 10/04/20  5:28 PM   Specimen: Nasopharyngeal Swab  Result Value Ref Range Status   SARS Coronavirus 2 by RT PCR NEGATIVE NEGATIVE Final    Comment: (NOTE) SARS-CoV-2 target nucleic acids are NOT DETECTED.  The SARS-CoV-2 RNA is generally detectable in upper respiratoy specimens during the acute phase of infection. The lowest concentration of SARS-CoV-2 viral copies this assay can detect is 131 copies/mL. A negative result does not preclude SARS-Cov-2 infection and should not be used as the sole basis  for treatment or other patient management decisions. A negative result may occur with  improper specimen collection/handling, submission of specimen other than nasopharyngeal swab, presence of viral mutation(s) within the areas targeted by this assay, and inadequate number of viral copies (<131 copies/mL). A negative result must be combined with clinical observations, patient history, and epidemiological information. The expected result is Negative.  Fact Sheet for Patients:  https://www.moore.com/  Fact Sheet for Healthcare Providers:  https://www.young.biz/  This test is no t yet approved or cleared by the [place] FDA and  has been authorized for detection and/or diagnosis of SARS-CoV-2 by FDA under an Emergency Use Authorization (EUA). This EUA will remain  in effect (meaning this test can be used) for the duration of the COVID-19 declaration under Section 564(b)(1) of the Act, 21 U.S.C. section 360bbb-3(b)(1), unless the authorization is terminated or revoked sooner.     Influenza A by PCR NEGATIVE NEGATIVE Final   Influenza B by PCR NEGATIVE NEGATIVE Final    Comment: (NOTE) The Xpert Xpress SARS-CoV-2/FLU/RSV assay is intended as an aid in  the diagnosis of influenza from Nasopharyngeal swab specimens and  should not be used as a sole basis for treatment. Nasal washings and  aspirates are unacceptable for Xpert Xpress SARS-CoV-2/FLU/RSV  testing.  Fact Sheet for Patients: https://www.moore.com/  Fact Sheet for Healthcare Providers: https://www.young.biz/  This test is not yet approved or cleared by the [place] FDA and  has been authorized for detection and/or diagnosis of SARS-CoV-2 by  FDA under an Emergency Use Authorization (EUA). This EUA will remain  in effect (meaning this test can be used) for the duration of the  Covid-19 declaration under Section 564(b)(1) of the Act, 21   U.S.C. section 360bbb-3(b)(1), unless the authorization is  terminated or revoked. Performed at [facility], [address] [place], [place] [address]   Urine culture     Status: Abnormal   Collection Time: 10/04/20  6:00 PM   Specimen: Urine, Random  Result Value Ref Range Status   Specimen Description   Final    URINE, RANDOM Performed at [facility], [address] [place], [place] [address]    Special Requests   Final    NONE Performed at [facility], [address] [place], [place] [address]    Culture MULTIPLE SPECIES PRESENT, SUGGEST RECOLLECTION (A)  Final   Report Status 10/05/2020 FINAL  Final  Culture, Urine     Status: Abnormal   Collection Time: 10/06/20 10:32 AM   Specimen: Urine, Catheterized  Result Value Ref Range Status   Specimen Description   Final    URINE, CATHETERIZED Performed at [facility], [address] [place], [place] [address]    Special Requests   Final    NONE Performed at [facility], [address], [place], [place] [address]    Culture MULTIPLE SPECIES PRESENT, SUGGEST RECOLLECTION (A)  Final   Report Status 10/07/2020 FINAL  Final  Culture, blood (Routine X 2) w Reflex to ID Panel     Status: None (Preliminary result)   Collection Time: 10/06/20  5:14 PM   Specimen: BLOOD  Result Value Ref Range Status   Specimen Description   Final    BLOOD RIGHT ANTECUBITAL Performed at [facility], [address] [place], [place] [address]    Special Requests   Final    BOTTLES DRAWN AEROBIC ONLY Blood Culture adequate volume Performed at [facility], [address] [place], [place] [address]    Culture   Final    NO GROWTH 3 DAYS Performed at [facility], [address] [place], [place] [address]    Report Status PENDING  Incomplete  Culture, blood (Routine X 2) w Reflex to ID Panel     Status: None (Preliminary  result)   Collection Time: 10/06/20  5:14 PM   Specimen: BLOOD  Result Value Ref Range Status   Specimen Description   Final    BLOOD LEFT ANTECUBITAL Performed at [facility], [address], [place], [place] [address]    Special Requests   Final    BOTTLES DRAWN AEROBIC ONLY Blood Culture adequate volume Performed at [facility], [address], [place], [place] [address]    Culture   Final    NO GROWTH 3 DAYS Performed at [facility], [address], [place], [place] [address]    Report Status PENDING  Incomplete         Radiology Studies: MR 3D Recon At Scanner  Result Date: 10/08/2020 CLINICAL DATA:  34 year old female with reported history of pancreatitis, found at pancreatitis on recent CT evaluation. EXAM: MRI ABDOMEN WITHOUT AND WITH CONTRAST (INCLUDING MRCP) TECHNIQUE: Multiplanar multisequence MR imaging of the abdomen was performed both before and after the administration of intravenous contrast. Heavily T2-weighted images of the biliary and pancreatic ducts were obtained, and three-dimensional MRCP images were rendered by post processing. CONTRAST:  56mL GADAVIST GADOBUTROL 1 MMOL/ML IV SOLN COMPARISON:  CT study from 10/04/2020 FINDINGS: Lower chest: Interval development of bilateral pleural effusions, trace on the RIGHT and small on the LEFT with associated basilar airspace disease. Airspace process not well evaluated on MRI. Hepatobiliary: Severe hepatic steatosis. No focal, suspicious hepatic lesion. Gallbladder moderately distended. No pericholecystic stranding. No biliary duct dilation. No filling defect in the common bile duct. Pancreas:  Interval development of pancreatic necrosis involving greater than 50% of the gland. Interval development of acute pancreatic collections with hemorrhagic and/or proteinaceous features adjacent to the head and neck of the pancreas extending into the hepatic gastric recess of the lesser sac  measuring 7.9 x 5.3 cm. Smaller amounts of fluid track into the central mesentery. Also with acute pancreatic collections adjacent to the tail tracking towards the LEFT subdiaphragmatic region between thew spleen and the stomach above the pancreatic tail. Scattered areas of acute pancreatic fluid in the anterior pararenal space on the LEFT. Small volume ascites. Some mass effect upon the splenic portal confluence. Highly narrowed splenic vein. Patent portal vein and SMV. Spleen: Spleen without focal lesion with mild-to-moderate enlargement approximately 14 cm greatest craniocaudal dimension. Adrenals/Urinary Tract: Adrenal glands are normal. Symmetric renal enhancement. No sign of hydronephrosis no suspicious renal lesion. Stomach/Bowel: Bowel edema and gastric edema in the setting of severe pancreatitis. 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 Other:  Small volume ascites and body wall edema. Musculoskeletal: No suspicious bone lesions identified. IMPRESSION: 1. 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 Also displaying marked worsening of stigmata of pancreatitis with peripancreatic collections some with hemorrhagic or proteinaceous features. 2. 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 3. Interval development of bilateral pleural effusions, trace on the RIGHT and small on the LEFT with associated basilar airspace disease. 4. Edema of the stomach and adjacent small bowel. 5. No findings of choledocholithiasis.  No biliary duct dilation. 6. Small volume ascites and body wall edema. 7. Severe hepatic steatosis. These results will be called to  the ordering clinician or representative by the Radiologist Assistant, and communication documented in the PACS or [employer] Dash[employer]. Electronically Signed   By: Donzetta KohutGeoffrey  Wile M.D.   On: 10/08/2020 20:56   MR ABDOMEN MRCP W WO CONTAST  Result Date: 10/08/2020 CLINICAL DATA:  34 year old female with reported history of pancreatitis, found at pancreatitis on recent CT evaluation. EXAM: MRI ABDOMEN WITHOUT AND WITH CONTRAST (INCLUDING MRCP) TECHNIQUE: Multiplanar multisequence MR imaging of the abdomen was performed both before and after the administration of intravenous contrast. Heavily T2-weighted images of the biliary and pancreatic ducts were obtained, and three-dimensional MRCP images were rendered by post processing. CONTRAST:  9mL GADAVIST GADOBUTROL 1 MMOL/ML IV SOLN COMPARISON:  CT study from 10/04/2020 FINDINGS: Lower chest: Interval development of bilateral pleural effusions, trace on the RIGHT and small on the LEFT with associated basilar airspace disease. Airspace process not well evaluated on MRI. Hepatobiliary: Severe hepatic steatosis. No focal, suspicious hepatic lesion. Gallbladder moderately distended. No pericholecystic stranding. No biliary duct dilation. No filling defect in the common bile duct. 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 Interval development of acute pancreatic collections with hemorrhagic and/or proteinaceous features adjacent to the head and neck of the pancreas extending into the hepatic gastric recess of the lesser sac measuring 7.9 x 5.3 cm. Smaller amounts of fluid track into the central mesentery. Also with acute pancreatic collections adjacent to the tail tracking towards the LEFT subdiaphragmatic region between thew spleen and the stomach above the pancreatic tail. Scattered areas of acute pancreatic  fluid in the anterior pararenal space on the LEFT. Small volume ascites. Some mass effect upon the splenic portal  confluence. Highly narrowed splenic vein. Patent portal vein and SMV. Spleen: Spleen without focal lesion with mild-to-moderate enlargement approximately 14 cm greatest craniocaudal dimension. Adrenals/Urinary Tract: Adrenal glands are normal. Symmetric renal enhancement. No sign of hydronephrosis no suspicious renal lesion. Stomach/Bowel: Bowel edema and gastric edema in the setting of severe pancreatitis. 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 Other:  Small volume ascites and body wall edema. Musculoskeletal: No suspicious bone lesions identified. IMPRESSION: 1. 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 Also displaying marked worsening of stigmata of pancreatitis with peripancreatic collections some with hemorrhagic or proteinaceous features. 2. 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 3. Interval development of bilateral pleural effusions, trace on the RIGHT and small on the LEFT with associated basilar airspace disease. 4. Edema of the stomach and adjacent small bowel. 5. No findings of choledocholithiasis.  No biliary duct dilation. 6. Small volume ascites and body wall edema. 7. Severe hepatic steatosis. These results will be called to the ordering clinician or representative by the Radiologist Assistant, and communication documented in the PACS or [employer]. Electronically Signed   By: Donzetta Kohut M.D.   On: 10/08/2020 20:56        Scheduled Meds: . ARIPiprazole  5 mg Oral Daily  . buPROPion  300 mg Oral q morning - 10a  . Chlorhexidine Gluconate Cloth  6 each Topical Daily  . fluticasone  1 spray Each Nare Daily   . gabapentin  600 mg Oral BID  . lisdexamfetamine  60 mg Oral q morning - 10a  . OLANZapine  2.5-5 mg Oral QHS  . polyethylene glycol  17 g Oral Daily  . sertraline  200 mg Oral q morning - 10a  . thiamine  100 mg Oral Daily   Or  . thiamine  100 mg Intravenous Daily   Continuous Infusions: . sodium chloride 150 mL/hr at 10/09/20 1105     LOS: 4 days    Alwyn Ren, MD 10/09/2020, 11:21 AM

## 2020-10-10 LAB — CBC
HCT: 31.2 % — ABNORMAL LOW (ref 36.0–46.0)
Hemoglobin: 10.3 g/dL — ABNORMAL LOW (ref 12.0–15.0)
MCH: 31.3 pg (ref 26.0–34.0)
MCHC: 33 g/dL (ref 30.0–36.0)
MCV: 94.8 fL (ref 80.0–100.0)
Platelets: 168 10*3/uL (ref 150–400)
RBC: 3.29 MIL/uL — ABNORMAL LOW (ref 3.87–5.11)
RDW: 13.8 % (ref 11.5–15.5)
WBC: 16.7 10*3/uL — ABNORMAL HIGH (ref 4.0–10.5)
nRBC: 0.1 % (ref 0.0–0.2)

## 2020-10-10 LAB — COMPREHENSIVE METABOLIC PANEL
ALT: 18 U/L (ref 0–44)
AST: 21 U/L (ref 15–41)
Albumin: 2.5 g/dL — ABNORMAL LOW (ref 3.5–5.0)
Alkaline Phosphatase: 107 U/L (ref 38–126)
Anion gap: 11 (ref 5–15)
BUN: <5 mg/dL — ABNORMAL LOW (ref 6–20)
CO2: 22 mmol/L (ref 22–32)
Calcium: 7.5 mg/dL — ABNORMAL LOW (ref 8.9–10.3)
Chloride: 104 mmol/L (ref 98–111)
Creatinine, Ser: 0.47 mg/dL (ref 0.44–1.00)
GFR, Estimated: >60 mL/min (ref >60)
Glucose, Bld: 112 mg/dL — ABNORMAL HIGH (ref 70–99)
Potassium: 3.2 mmol/L — ABNORMAL LOW (ref 3.5–5.1)
Sodium: 137 mmol/L (ref 135–145)
Total Bilirubin: 1.1 mg/dL (ref 0.3–1.2)
Total Protein: 5.1 g/dL — ABNORMAL LOW (ref 6.5–8.1)

## 2020-10-10 LAB — MAGNESIUM: Magnesium: 1.9 mg/dL (ref 1.7–2.4)

## 2020-10-10 MED ORDER — BISACODYL 5 MG PO TBEC
10.0000 mg | DELAYED_RELEASE_TABLET | Freq: Every day | ORAL | Status: DC
  Administered 2020-10-14: 10 mg via ORAL
  Filled 2020-10-10 (×3): qty 2

## 2020-10-10 MED ORDER — ALBUTEROL SULFATE (2.5 MG/3ML) 0.083% IN NEBU: 2.5000 mg | INHALATION_SOLUTION | Freq: Four times a day (QID) | RESPIRATORY_TRACT | Status: DC | PRN

## 2020-10-10 MED ORDER — POTASSIUM CHLORIDE CRYS ER 20 MEQ PO TBCR
40.0000 meq | EXTENDED_RELEASE_TABLET | Freq: Once | ORAL | Status: AC
  Administered 2020-10-10: 40 meq via ORAL
  Filled 2020-10-10: qty 2

## 2020-10-10 MED ORDER — HYDROMORPHONE HCL 2 MG PO TABS
1.0000 mg | ORAL_TABLET | ORAL | Status: DC | PRN
  Administered 2020-10-10 – 2020-10-13 (×19): 1 mg via ORAL
  Filled 2020-10-10 (×19): qty 1

## 2020-10-10 MED ORDER — ALBUTEROL SULFATE (2.5 MG/3ML) 0.083% IN NEBU: 2.5000 mg | INHALATION_SOLUTION | Freq: Four times a day (QID) | RESPIRATORY_TRACT | Status: DC

## 2020-10-10 NOTE — Progress Notes (Signed)
[facility] Gastroenterology Progress Note  Michelle Braun 34 y.o. 06-Jan-1986   Subjective: Sleeping comfortably and easily aroused. Boyfriend at bedside. Nurse present during my evaluation. Complains of abdominal pain. Denies N/V. Tolerating full liquid diet.  Objective: Vital signs: Vitals:   10/09/20 2122 10/10/20 0604  BP: (!) 138/94 (!) 144/96  Pulse: (!) 102 100  Resp: 17 17  Temp: 98.6 F (37 C) 98.4 F (36.9 C)  SpO2: 98% 97%    Physical Exam: Gen: lethargic, no acute distress, obese HEENT: anicteric sclera CV: RRR Chest: CTA B Abd: diffuse tenderness (greatest in upper quadrant) with guarding, soft, nondistended, +BS, obese Ext: no edema  Lab Results: Recent Labs    10/09/20 0500 10/10/20 1120  NA 135 137  K 3.3* 3.2*  CL 104 104  CO2 20* 22  GLUCOSE 104* 112*  BUN <5* <5*  CREATININE 0.57 0.47  CALCIUM 7.6* 7.5*  MG  --  1.9   Recent Labs    10/09/20 0500 10/10/20 1120  AST 34 21  ALT 22 18  ALKPHOS 124 107  BILITOT 1.1 1.1  PROT 5.2* 5.1*  ALBUMIN 2.6* 2.5*   Recent Labs    10/09/20 0500 10/10/20 1120  WBC 16.6* 16.7*  HGB 10.8* 10.3*  HCT 33.8* 31.2*  MCV 97.1 94.8  PLT 141* 168      Assessment/Plan: 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 If she stops drinking alcohol, then may not develop any long-term sequelae of pancreatitis. Continue supportive care. Ideally should be able to tolerate low fat diet prior to discharge and currently on full liquid diet. Consider slowly advancing diet tomorrow. Will re-visit on 10/12/20. Contact before if questions.   [name] 10/10/2020, 3:36 PM  Questions please call [phone] ID: Michelle Braun, female   DOB: 1986/05/09, 34 y.o.   MRN: [id number]

## 2020-10-10 NOTE — Progress Notes (Signed)
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 This lasted throughout the weekend and she went to an urgent care and given zofran and was directed to the ED. Admits that she drinks 1-2 shots of vodka nightly since the beginning of the pandemic. No abdominal surgeries, no heavy NSAID use. No diarrhea. States that the morphine initially helped her in the ED but now is not really helping her pain. Admits to having an appetite and willing to try to eat.   In the ED, she was afebrile, tachycardic, hypertensive and stable on room air. Notable labs: Lipase 1287, AST 67->58, ALT 50-> 37, T bili 1.8, WBC 18.2->20, Hb 17.1, Lactic acid 1.3-> 2.0, Mg 1.5 this AM. UA contaminated. RUQ [place]: hepatic steatosis, no cholelithiasis, no acute abnormality. CT abd/pelvis W contrast: acute pancreatitis with a small hypoencancement area of the pancreatic head/uncinate could be edema vs. Early necrosis without peripancreatic fluid collection or walled off necrosis.  Assessment & Plan:   Principal Problem:   Acute alcoholic pancreatitis Active Problems:   Transaminitis   Hypomagnesemia   Alcohol abuse   Depression   Anxiety   Acute lower UTI   Leukocytosis   Hypertension   #1 acute pancreatitis secondary to alcohol abuse.  Lipase levels on admission was 1287 which is down to 38. Patient continues to complain of abdominal pain and abdominal distention. MRI/MRCP 10/08/2020 findings concerning for necrotizing pancreatitis and early pseudocyst. Patient has been on IV fluids normal saline at 150 cc an hour  since admission.  Remains on full liquid diet Encouraged her to get out of bed ambulate.  She tells me she cannot move around or sit up due to abdominal pain and discomfort. Her white count remains elevated at 16 throughout.  DC imipenem. Discussed with her in detail about why I am tapering Dilaudid.  Patient is able to tolerate a full liquid diet, switching Dilaudid to p.o.  #2 EtOH abuse consult regarding alcohol cessation.  Continue Ativan as needed.  Patient is very anxious.  #3 depression anxiety continue Abilify.  Continue Wellbutrin and Zyprexa vyvansse  #4 hypokalemia potassium is 3.2 replete potassium.  Magnesium 1.9.  #5 history of asthma patient is actively wheezing today we will start nebulizer treatments.  Estimated body mass index is 31.87 kg/m as calculated from the following:   Height as of this encounter: 6' (1.829 m).   Weight as of this encounter: 106.6 kg.  DVT prophylaxis: Lovenox  code Status: Family Communication:  Disposition Plan:  Status is: Inpatient  Dispo: The patient is from: Home              Anticipated d/c is to: Home              Anticipated d/c date is 2 day              Patient currently is not medically stable to d/c.    Consultants: None  Procedures: None Antimicrobials: Anti-infectives (From admission, onward)   Start     Dose/Rate Route Frequency Ordered Stop   10/08/20 2200  meropenem (MERREM) 1 g in sodium chloride 0.9 % 100 mL IVPB  Status:  Discontinued        1 g 200 mL/hr over 30 Minutes Intravenous Every 8 hours 10/08/20 2134 10/09/20 1110   10/06/20 1400  meropenem (MERREM) 1 g in sodium chloride 0.9 % 100 mL IVPB  Status:  Discontinued        1 g 200 mL/hr over 30 Minutes Intravenous Every 8 hours 10/06/20 1253 10/08/20 1156       Subjective:  Patient resting in bed, continues to complain of severe abdominal pain and anxiety.  She is going to find a PCP for herself today.  She denies having any bowel movements nausea  vomiting. She reports severe anxiety and tremors from not drinking alcohol. Objective: Vitals:   10/09/20 0521 10/09/20 1330 10/09/20 2122 10/10/20 0604  BP: (!) 131/91 (!) 146/97 (!) 138/94 (!) 144/96  Pulse: (!) 107 99 (!) 102 100  Resp: 17 19 17 17   Temp: 98.9 F (37.2 C) 98.6 F (37 C) 98.6 F (37 C) 98.4 F (36.9 C)  TempSrc: Oral Oral Oral Oral  SpO2: 97% 98% 98% 97%  Weight:      Height:        Intake/Output Summary (Last 24 hours) at 10/10/2020 1212 Last data filed at 10/10/2020 1000 Gross per 24 hour  Intake 3787.49 ml  Output --  Net 3787.49 ml   Filed Weights   10/04/20 1454  Weight: 106.6 kg    Examination:  General exam: Appears in mild distress due to pain Respiratory system: Clear to auscultation. Respiratory effort normal. Cardiovascular system: S1 & S2 heard, RRR. No JVD, murmurs, rubs, gallops or clicks. No pedal edema. Gastrointestinal system: Abdomen is distended, soft and minimal tender lower abdomen. No organomegaly or masses felt. Normal bowel sounds heard. Central nervous system: Alert and oriented. No focal neurological deficits. Extremities: Symmetric 5 x 5 power. Skin: No rashes, lesions or ulcers Psychiatry: Judgement and insight appear normal. Mood & affect appropriate.     Data Reviewed: I have personally reviewed following labs and imaging studies  CBC: Recent Labs  Lab 10/05/20 0421 10/06/20 0326 10/07/20 0335 10/09/20 0500 10/10/20 1120  WBC 20.3* 22.3* 17.6* 16.6* 16.7*  NEUTROABS 18.4*  --  13.3*  --   --   HGB 16.3* 13.5 12.0 10.8* 10.3*  HCT 50.2* 42.2 38.0 33.8* 31.2*  MCV 96.0 99.1 99.7 97.1 94.8  PLT 183 156 167 141* 168   Basic Metabolic Panel: Recent Labs  Lab 10/05/20 0421 10/05/20 0421 10/06/20 0326 10/06/20 0953 10/07/20 0335 10/09/20 0500 10/10/20 1120  NA 136  --  135  --  133* 135 137  K 4.0   < > 5.7* 4.2 3.9 3.3* 3.2*  CL 104  --  102  --  106 104 104  CO2 20*  --  21*  --  18* 20* 22  GLUCOSE  123*  --  131*  --  97 104* 112*  BUN 10  --  16  --  14 <5* <5*  CREATININE 0.78  --  1.16*  --  0.75 0.57 0.47  CALCIUM 8.5*  --  8.3*  --  7.6* 7.6* 7.5*  MG 1.5*  --   --  2.5*  --   --  1.9   < > = values in this interval not displayed.   GFR: Estimated Creatinine Clearance: 136.6 mL/min (by C-G formula based on SCr of 0.47 mg/dL). Liver Function Tests: Recent Labs  Lab 10/05/20 0421 10/06/20 0326 10/07/20 0335 10/09/20 0500 10/10/20 1120  AST 58* 50* 46* 34 21  ALT 37 33 ALKPHOS 69 67 78 124 107  BILITOT 1.8* 1.5* 1.5* 1.1 1.1  PROT 7.1 6.2* 5.8* 5.2* 5.1*  ALBUMIN 3.9 3.2* 3.0* 2.6* 2.5*   Recent Labs  Lab 10/04/20 1500 10/06/20 0326 10/07/20 0335 10/09/20 0500  LIPASE 1,287* 379* 128* 38   No results for input(s): AMMONIA in the last 168 hours. Coagulation Profile: No results for input(s): INR, PROTIME in the last 168 hours. Cardiac Enzymes: No results for input(s): CKTOTAL, CKMB, CKMBINDEX, TROPONINI in the last 168 hours. BNP (last 3 results) No results for input(s): PROBNP in the last 8760 hours. HbA1C: No results for input(s): HGBA1C in the last 72 hours. CBG: No results for input(s): GLUCAP in the last 168 hours. Lipid Profile: No results for input(s): CHOL, HDL, LDLCALC, TRIG, CHOLHDL, LDLDIRECT in the last 72 hours. Thyroid Function Tests: No results for input(s): TSH, T4TOTAL, FREET4, T3FREE, THYROIDAB in the last 72 hours. Anemia Panel: No results for input(s): VITAMINB12, FOLATE, FERRITIN, TIBC, IRON, RETICCTPCT in the last 72 hours. Sepsis Labs: Recent Labs  Lab 10/04/20 2220 10/05/20 0421 10/06/20 0838  LATICACIDVEN 1.3 2.0* 1.3    Recent Results (from the past 240 hour(s))  Respiratory Panel by RT PCR (Flu A&B, Covid) - Nasopharyngeal Swab     Status: None   Collection Time: 10/04/20  5:28 PM   Specimen: Nasopharyngeal Swab  Result Value Ref Range Status   SARS Coronavirus 2 by RT PCR NEGATIVE NEGATIVE Final    Comment:  (NOTE) SARS-CoV-2 target nucleic acids are NOT DETECTED.  The SARS-CoV-2 RNA is generally detectable in upper respiratoy specimens during the acute phase of infection. The lowest concentration of SARS-CoV-2 viral copies this assay can detect is 131 copies/mL. A negative result does not preclude SARS-Cov-2 infection and should not be used as the sole basis for treatment or other patient management decisions. A negative result may occur with  improper specimen collection/handling, submission of specimen other than nasopharyngeal swab, presence of viral mutation(s) within the areas targeted by this assay, and inadequate number of viral copies (<131 copies/mL). A negative result must be combined with clinical observations, patient history, and epidemiological information. The expected result is Negative.  Fact Sheet for Patients:  https://www.moore.com/  Fact Sheet for Healthcare Providers:  https://www.young.biz/  This test is no t yet approved or cleared by the [place] FDA and  has been authorized for detection and/or diagnosis of SARS-CoV-2 by FDA under an Emergency Use Authorization (EUA). This EUA will remain  in effect (meaning this test can be used) for the duration of the COVID-19 declaration under Section 564(b)(1) of the Act, 21 U.S.C. section 360bbb-3(b)(1), unless the authorization is terminated or revoked sooner.     Influenza A by PCR NEGATIVE NEGATIVE Final   Influenza B by PCR NEGATIVE NEGATIVE Final    Comment: (NOTE) The Xpert Xpress SARS-CoV-2/FLU/RSV assay is intended as an aid in  the diagnosis of influenza from Nasopharyngeal swab specimens and  should not be used as a sole basis for treatment. Nasal washings and  aspirates are unacceptable for Xpert Xpress SARS-CoV-2/FLU/RSV  testing.  Fact Sheet for Patients: https://www.moore.com/  Fact Sheet for Healthcare  Providers: https://www.young.biz/  This test is not yet approved or cleared by the [place] FDA and  has been authorized for detection and/or diagnosis of SARS-CoV-2 by  FDA under an Emergency Use Authorization (EUA). This EUA will remain  in effect (meaning this  test can be used) for the duration of the  Covid-19 declaration under Section 564(b)(1) of the Act, 21  U.S.C. section 360bbb-3(b)(1), unless the authorization is  terminated or revoked. Performed at [facility], [address] [place], [place] [address]   Urine culture     Status: Abnormal   Collection Time: 10/04/20  6:00 PM   Specimen: Urine, Random  Result Value Ref Range Status   Specimen Description   Final    URINE, RANDOM Performed at [facility], [address] [place], [place] [address]    Special Requests   Final    NONE Performed at [facility], [address] [place], [place] [address]    Culture MULTIPLE SPECIES PRESENT, SUGGEST RECOLLECTION (A)  Final   Report Status 10/05/2020 FINAL  Final  Culture, Urine     Status: Abnormal   Collection Time: 10/06/20 10:32 AM   Specimen: Urine, Catheterized  Result Value Ref Range Status   Specimen Description   Final    URINE, CATHETERIZED Performed at [facility], [address], [place], [place] [address]    Special Requests   Final    NONE Performed at [facility], [address], [place], [place] [address]    Culture MULTIPLE SPECIES PRESENT, SUGGEST RECOLLECTION (A)  Final   Report Status 10/07/2020 FINAL  Final  Culture, blood (Routine X 2) w Reflex to ID Panel     Status: None (Preliminary result)   Collection Time: 10/06/20  5:14 PM   Specimen: BLOOD  Result Value Ref Range Status   Specimen Description   Final    BLOOD RIGHT ANTECUBITAL Performed at [facility], [address], [place], [place] [address]    Special Requests    Final    BOTTLES DRAWN AEROBIC ONLY Blood Culture adequate volume Performed at [facility], [address] [place], [place] [address]    Culture   Final    NO GROWTH 4 DAYS Performed at [facility], [address] [place], [place] [address]    Report Status PENDING  Incomplete  Culture, blood (Routine X 2) w Reflex to ID Panel     Status: None (Preliminary result)   Collection Time: 10/06/20  5:14 PM   Specimen: BLOOD  Result Value Ref Range Status   Specimen Description   Final    BLOOD LEFT ANTECUBITAL Performed at [facility], [address], [place], [place] [address]    Special Requests   Final    BOTTLES DRAWN AEROBIC ONLY Blood Culture adequate volume Performed at [facility], [address] [place], [place] [address]    Culture   Final    NO GROWTH 4 DAYS Performed at [facility], [address], [place], [place] [address]    Report Status PENDING  Incomplete         Radiology Studies: MR 3D Recon At Scanner  Result Date: 10/08/2020 CLINICAL DATA:  34 year old female with reported history of pancreatitis, found at pancreatitis on recent CT evaluation. EXAM: MRI ABDOMEN WITHOUT AND WITH CONTRAST (INCLUDING MRCP) TECHNIQUE: Multiplanar multisequence MR imaging of the abdomen was performed both before and after the administration of intravenous contrast. Heavily T2-weighted images of the biliary and pancreatic ducts were obtained, and three-dimensional MRCP images were rendered by post processing. CONTRAST:  58mL GADAVIST GADOBUTROL 1 MMOL/ML IV SOLN COMPARISON:  CT  study from 10/04/2020 FINDINGS: Lower chest: Interval development of bilateral pleural effusions, trace on the RIGHT and small on the LEFT with associated basilar airspace disease. Airspace process not well evaluated on MRI. Hepatobiliary: Severe hepatic steatosis. No focal, suspicious hepatic lesion. Gallbladder moderately distended. No  pericholecystic stranding. No biliary duct dilation. No filling defect in the common bile duct. 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 Interval development of acute pancreatic collections with hemorrhagic and/or proteinaceous features adjacent to the head and neck of the pancreas extending into the hepatic gastric recess of the lesser sac measuring 7.9 x 5.3 cm. Smaller amounts of fluid track into the central mesentery. Also with acute pancreatic collections adjacent to the tail tracking towards the LEFT subdiaphragmatic region between thew spleen and the stomach above the pancreatic tail. Scattered areas of acute pancreatic fluid in the anterior pararenal space on the LEFT. Small volume ascites. Some mass effect upon the splenic portal confluence. Highly narrowed splenic vein. Patent portal vein and SMV. Spleen: Spleen without focal lesion with mild-to-moderate enlargement approximately 14 cm greatest craniocaudal dimension. Adrenals/Urinary Tract: Adrenal glands are normal. Symmetric renal enhancement. No sign of hydronephrosis no suspicious renal lesion. Stomach/Bowel: Bowel edema and gastric edema in the setting of severe pancreatitis. 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 Other:  Small volume ascites and body wall edema. Musculoskeletal: No suspicious bone lesions identified. IMPRESSION: 1. 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 Also displaying marked worsening of stigmata of pancreatitis with peripancreatic collections some with hemorrhagic or proteinaceous features. 2. 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 3. Interval development of bilateral pleural effusions, trace on the RIGHT and small on the LEFT with associated basilar airspace disease. 4. Edema of the stomach and adjacent small bowel. 5. No findings of choledocholithiasis.  No biliary duct dilation. 6. Small volume ascites and body wall edema. 7. Severe hepatic steatosis. These results will be called to the ordering clinician or representative by the Radiologist Assistant, and communication documented in the PACS or [employer]. Electronically Signed   By: Donzetta Kohut M.D.   On: 10/08/2020 20:56   MR ABDOMEN MRCP W WO CONTAST  Result Date: 10/08/2020 CLINICAL DATA:  34 year old female with reported history of pancreatitis, found at pancreatitis on recent CT evaluation. EXAM: MRI ABDOMEN WITHOUT AND WITH CONTRAST (INCLUDING MRCP) TECHNIQUE: Multiplanar multisequence MR imaging of the abdomen was performed both before and after the administration of intravenous contrast. Heavily T2-weighted images of the biliary and pancreatic ducts were obtained, and three-dimensional MRCP images were rendered by post processing. CONTRAST:  69mL GADAVIST GADOBUTROL 1 MMOL/ML IV SOLN COMPARISON:  CT study from 10/04/2020 FINDINGS: Lower chest: Interval development of bilateral pleural effusions, trace on the RIGHT and small on the LEFT with associated basilar airspace disease. Airspace process not well evaluated on MRI. Hepatobiliary: Severe hepatic steatosis. No focal, suspicious hepatic lesion. Gallbladder moderately distended. No pericholecystic stranding. No biliary duct dilation. No filling defect in the common bile duct. 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 Interval development of acute pancreatic collections with hemorrhagic and/or proteinaceous features adjacent to  the head and neck of the pancreas extending into the hepatic gastric recess of the lesser sac measuring  7.9 x 5.3 cm. Smaller amounts of fluid track into the central mesentery. Also with acute pancreatic collections adjacent to the tail tracking towards the LEFT subdiaphragmatic region between thew spleen and the stomach above the pancreatic tail. Scattered areas of acute pancreatic fluid in the anterior pararenal space on the LEFT. Small volume ascites. Some mass effect upon the splenic portal confluence. Highly narrowed splenic vein. Patent portal vein and SMV. Spleen: Spleen without focal lesion with mild-to-moderate enlargement approximately 14 cm greatest craniocaudal dimension. Adrenals/Urinary Tract: Adrenal glands are normal. Symmetric renal enhancement. No sign of hydronephrosis no suspicious renal lesion. Stomach/Bowel: Bowel edema and gastric edema in the setting of severe pancreatitis. 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 Other:  Small volume ascites and body wall edema. Musculoskeletal: No suspicious bone lesions identified. IMPRESSION: 1. 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 Also displaying marked worsening of stigmata of pancreatitis with peripancreatic collections some with hemorrhagic or proteinaceous features. 2. 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 3. Interval development of bilateral pleural effusions, trace on the RIGHT and small on the LEFT with associated basilar airspace disease. 4. Edema of the stomach and adjacent small bowel. 5. No findings of choledocholithiasis.  No biliary duct dilation. 6. Small volume ascites and body wall edema. 7. Severe hepatic steatosis. These results will be called to the ordering  clinician or representative by the Radiologist Assistant, and communication documented in the PACS or [employer]. Electronically Signed   By: Donzetta Kohut M.D.   On: 10/08/2020 20:56        Scheduled Meds: . ARIPiprazole  5 mg Oral Daily  . buPROPion  300 mg Oral q morning - 10a  . Chlorhexidine Gluconate Cloth  6 each Topical Daily  . fluticasone  1 spray Each Nare Daily  . gabapentin  600 mg Oral BID  . lisdexamfetamine  60 mg Oral q morning - 10a  . OLANZapine  2.5-5 mg Oral QHS  . polyethylene glycol  17 g Oral Daily  . sertraline  200 mg Oral q morning - 10a  . thiamine  100 mg Oral Daily   Or  . thiamine  100 mg Intravenous Daily   Continuous Infusions: . sodium chloride 150 mL/hr at 10/10/20 1120     LOS: 5 days    Alwyn Ren, MD 10/10/2020, 12:12 PM

## 2020-10-10 NOTE — Plan of Care (Signed)
  Problem: Education: Goal: Knowledge of General Education information will improve Description: Including pain rating scale, medication(s)/side effects and non-pharmacologic comfort measures Outcome: Progressing   Problem: Health Behavior/Discharge Planning: Goal: Ability to manage health-related needs will improve Outcome: Progressing   Problem: Clinical Measurements: Goal: Diagnostic test results will improve Outcome: Progressing   

## 2020-10-10 NOTE — Progress Notes (Signed)
Patient father called to discuss that patient expressed concerns r/t her plan of care (esp r/t her pain medications)  Charge nurse spoke to father and explained that we would have to get approval from patient to discuss. I reviewed physicians notes and talked to patient about the plan of care/consults and all medications she is on or any changes to them and the reasoning for the changes. Patient verbalized understanding, and had no further questions. She is aware that her father called and she will call him to discuss the plan, If he calls to discuss she said we are allowed to give him information. Ceasar Lund RN

## 2020-10-11 LAB — CULTURE, BLOOD (ROUTINE X 2)
Culture: NO GROWTH
Culture: NO GROWTH
Special Requests: ADEQUATE
Special Requests: ADEQUATE

## 2020-10-11 MED ORDER — LISDEXAMFETAMINE DIMESYLATE 20 MG PO CAPS
60.0000 mg | ORAL_CAPSULE | Freq: Every morning | ORAL | Status: DC
  Administered 2020-10-12 – 2020-10-15 (×4): 60 mg via ORAL
  Filled 2020-10-11 (×4): qty 3

## 2020-10-11 MED ORDER — LISDEXAMFETAMINE DIMESYLATE 20 MG PO CAPS
40.0000 mg | ORAL_CAPSULE | Freq: Once | ORAL | Status: AC
  Administered 2020-10-11: 40 mg via ORAL
  Filled 2020-10-11: qty 2

## 2020-10-11 NOTE — Plan of Care (Signed)
Plan of care reviewed and discussed with the patient. 

## 2020-10-11 NOTE — Progress Notes (Signed)
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 This lasted throughout the weekend and she went to an urgent care and given zofran and was directed to the ED. Admits that she drinks 1-2 shots of vodka nightly since the beginning of the pandemic. No abdominal surgeries, no heavy NSAID use. No diarrhea. States that the morphine initially helped her in the ED but now is not really helping her pain. Admits to having an appetite and willing to try to eat.   In the ED, she was afebrile, tachycardic, hypertensive and stable on room air. Notable labs: Lipase 1287, AST 67->58, ALT 50-> 37, T bili 1.8, WBC 18.2->20, Hb 17.1, Lactic acid 1.3-> 2.0, Mg 1.5 this AM. UA contaminated. RUQ [place]: hepatic steatosis, no cholelithiasis, no acute abnormality. CT abd/pelvis W contrast: acute pancreatitis with a small hypoencancement area of the pancreatic head/uncinate could be edema vs. Early necrosis without peripancreatic fluid collection or walled off necrosis.  Assessment & Plan:   Principal Problem:   Acute alcoholic pancreatitis Active Problems:   Transaminitis   Hypomagnesemia   Alcohol abuse   Depression   Anxiety   Acute lower UTI   Leukocytosis   Hypertension   #1 acute pancreatitis secondary to alcohol abuse.  Lipase levels on admission was 1287 which is down to 38. Patient continues to complain of abdominal pain and abdominal distention. MRI/MRCP 10/08/2020 findings concerning for necrotizing pancreatitis and early pseudocyst. Patient has been on IV fluids normal saline at 150 cc an hour  since admission. Will decrease fluids to 75 cc an hour. GI advancing diet to soft diet. Encourage her to get out of bed ambulate as much as possible.  #2 EtOH abuse consult regarding alcohol cessation.  Continue Ativan as needed.  Patient is very anxious. Patient reports she drinks because she has chronic back pain which she cannot tolerate. Advised her to seek help with Ortho/chiropractor/pain management.  #3 depression anxiety continue Abilify.  Continue Wellbutrin and Zyprexa vyvansse  #4 hypokalemia potassium is 3.2 replete potassium.  Magnesium 1.9. Follow-up labs in a.m.  #5 history of asthma patient is actively wheezing today we will start nebulizer treatments.  Estimated body mass index is 31.87 kg/m as calculated from the following:   Height as of this encounter: 6' (1.829 m).   Weight as of this encounter: 106.6 kg.  DVT prophylaxis: Lovenox  code Status: Full code Family Communication: None at bedside Disposition Plan:  Status is: Inpatient  Dispo: The patient is from: Home              Anticipated d/c is to: Home              Anticipated d/c date is 2 day              Patient currently is not medically stable to d/c.    Consultants: None  Procedures: None Antimicrobials: Anti-infectives (From admission, onward)   Start     Dose/Rate Route Frequency Ordered Stop   10/08/20 2200  meropenem (MERREM) 1 g in sodium chloride 0.9 % 100 mL IVPB  Status:  Discontinued        1 g  200 mL/hr over 30 Minutes Intravenous Every 8 hours 10/08/20 2134 10/09/20 1110   10/06/20 1400  meropenem (MERREM) 1 g in sodium chloride 0.9 % 100 mL IVPB  Status:  Discontinued        1 g 200 mL/hr over 30 Minutes Intravenous Every 8 hours 10/06/20 1253 10/08/20 1156       Subjective:  Patient is awake alert continues to complain of abdominal pain and distention though better. Advancing diet to soft diet today.   Objective: Vitals:   10/10/20 0604 10/10/20 1600 10/10/20 2027 10/11/20  0539  BP: (!) 144/96 (!) 143/95 (!) 139/96 (!) 143/93  Pulse: 100 (!) 102 (!) 107 (!) 101  Resp: Temp: 98.4 F (36.9 C) 98.7 F (37.1 C) 100 F (37.8 C) 98.4 F (36.9 C)  TempSrc: Oral Oral Oral Oral  SpO2: 97% 97% 96% 95%  Weight:      Height:        Intake/Output Summary (Last 24 hours) at 10/11/2020 1307 Last data filed at 10/11/2020 1100 Gross per 24 hour  Intake 4263.27 ml  Output --  Net 4263.27 ml   Filed Weights   10/04/20 1454  Weight: 106.6 kg    Examination:  General exam: Appears in mild distress due to pain Respiratory system: Clear to auscultation. Respiratory effort normal. Cardiovascular system: S1 & S2 heard, RRR. No JVD, murmurs, rubs, gallops or clicks. No pedal edema. Gastrointestinal system: Abdomen is distended, soft and minimal tender lower abdomen. No organomegaly or masses felt. Normal bowel sounds heard. Central nervous system: Alert and oriented. No focal neurological deficits. Extremities: Symmetric 5 x 5 power. Skin: No rashes, lesions or ulcers Psychiatry: Judgement and insight appear normal. Mood & affect appropriate.     Data Reviewed: I have personally reviewed following labs and imaging studies  CBC: Recent Labs  Lab 10/05/20 0421 10/06/20 0326 10/07/20 0335 10/09/20 0500 10/10/20 1120  WBC 20.3* 22.3* 17.6* 16.6* 16.7*  NEUTROABS 18.4*  --  13.3*  --   --   HGB 16.3* 13.5 12.0 10.8* 10.3*  HCT 50.2* 42.2 38.0 33.8* 31.2*  MCV 96.0 99.1 99.7 97.1 94.8  PLT 183 156 167 141* 168   Basic Metabolic Panel: Recent Labs  Lab 10/05/20 0421 10/05/20 0421 10/06/20 0326 10/06/20 0953 10/07/20 0335 10/09/20 0500 10/10/20 1120  NA 136  --  135  --  133* 135 137  K 4.0   < > 5.7* 4.2 3.9 3.3* 3.2*  CL 104  --  102  --  106 104 104  CO2 20*  --  21*  --  18* 20* 22  GLUCOSE 123*  --  131*  --  97 104* 112*  BUN 10  --  16  --  14 <5* <5*  CREATININE 0.78  --  1.16*  --  0.75 0.57 0.47  CALCIUM 8.5*  --  8.3*  --   7.6* 7.6* 7.5*  MG 1.5*  --   --  2.5*  --   --  1.9   < > = values in this interval not displayed.   GFR: Estimated Creatinine Clearance: 136.6 mL/min (by C-G formula based on SCr of 0.47 mg/dL). Liver Function Tests: Recent Labs  Lab 10/05/20 0421 10/06/20 0326 10/07/20 0335 10/09/20 0500 10/10/20 1120  AST 58* 50* 46* 34 21  ALT 37 33 ALKPHOS 69 67 78 124 107  BILITOT 1.8* 1.5* 1.5* 1.1 1.1  PROT 7.1  6.2* 5.8* 5.2* 5.1*  ALBUMIN 3.9 3.2* 3.0* 2.6* 2.5*   Recent Labs  Lab 10/04/20 1500 10/06/20 0326 10/07/20 0335 10/09/20 0500  LIPASE 1,287* 379* 128* 38   No results for input(s): AMMONIA in the last 168 hours. Coagulation Profile: No results for input(s): INR, PROTIME in the last 168 hours. Cardiac Enzymes: No results for input(s): CKTOTAL, CKMB, CKMBINDEX, TROPONINI in the last 168 hours. BNP (last 3 results) No results for input(s): PROBNP in the last 8760 hours. HbA1C: No results for input(s): HGBA1C in the last 72 hours. CBG: No results for input(s): GLUCAP in the last 168 hours. Lipid Profile: No results for input(s): CHOL, HDL, LDLCALC, TRIG, CHOLHDL, LDLDIRECT in the last 72 hours. Thyroid Function Tests: No results for input(s): TSH, T4TOTAL, FREET4, T3FREE, THYROIDAB in the last 72 hours. Anemia Panel: No results for input(s): VITAMINB12, FOLATE, FERRITIN, TIBC, IRON, RETICCTPCT in the last 72 hours. Sepsis Labs: Recent Labs  Lab 10/04/20 2220 10/05/20 0421 10/06/20 0838  LATICACIDVEN 1.3 2.0* 1.3    Recent Results (from the past 240 hour(s))  Respiratory Panel by RT PCR (Flu A&B, Covid) - Nasopharyngeal Swab     Status: None   Collection Time: 10/04/20  5:28 PM   Specimen: Nasopharyngeal Swab  Result Value Ref Range Status   SARS Coronavirus 2 by RT PCR NEGATIVE NEGATIVE Final    Comment: (NOTE) SARS-CoV-2 target nucleic acids are NOT DETECTED.  The SARS-CoV-2 RNA is generally detectable in upper respiratoy specimens during the  acute phase of infection. The lowest concentration of SARS-CoV-2 viral copies this assay can detect is 131 copies/mL. A negative result does not preclude SARS-Cov-2 infection and should not be used as the sole basis for treatment or other patient management decisions. A negative result may occur with  improper specimen collection/handling, submission of specimen other than nasopharyngeal swab, presence of viral mutation(s) within the areas targeted by this assay, and inadequate number of viral copies (<131 copies/mL). A negative result must be combined with clinical observations, patient history, and epidemiological information. The expected result is Negative.  Fact Sheet for Patients:  https://www.moore.com/https://www.fda.gov/media/142436/download  Fact Sheet for Healthcare Providers:  https://www.young.biz/https://www.fda.gov/media/142435/download  This test is no t yet approved or cleared by the [place] FDA and  has been authorized for detection and/or diagnosis of SARS-CoV-2 by FDA under an Emergency Use Authorization (EUA). This EUA will remain  in effect (meaning this test can be used) for the duration of the COVID-19 declaration under Section 564(b)(1) of the Act, 21 U.S.C. section 360bbb-3(b)(1), unless the authorization is terminated or revoked sooner.     Influenza A by PCR NEGATIVE NEGATIVE Final   Influenza B by PCR NEGATIVE NEGATIVE Final    Comment: (NOTE) The Xpert Xpress SARS-CoV-2/FLU/RSV assay is intended as an aid in  the diagnosis of influenza from Nasopharyngeal swab specimens and  should not be used as a sole basis for treatment. Nasal washings and  aspirates are unacceptable for Xpert Xpress SARS-CoV-2/FLU/RSV  testing.  Fact Sheet for Patients: https://www.moore.com/https://www.fda.gov/media/142436/download  Fact Sheet for Healthcare Providers: https://www.young.biz/https://www.fda.gov/media/142435/download  This test is not yet approved or cleared by the [place] FDA and  has been authorized for detection and/or diagnosis  of SARS-CoV-2 by  FDA under an Emergency Use Authorization (EUA). This EUA will remain  in effect (meaning this test can be used) for the duration of the  Covid-19 declaration under Section 564(b)(1) of the Act, 21  U.S.C. section 360bbb-3(b)(1), unless the authorization is  terminated or  revoked. Performed at [facility], [address], [place], [place] [address]   Urine culture     Status: Abnormal   Collection Time: 10/04/20  6:00 PM   Specimen: Urine, Random  Result Value Ref Range Status   Specimen Description   Final    URINE, RANDOM Performed at [facility], [address] [place] [address] [place], [place] [address]    Special Requests   Final    NONE Performed at [facility], [address] [place], [place] [address]    Culture MULTIPLE SPECIES PRESENT, SUGGEST RECOLLECTION (A)  Final   Report Status 10/05/2020 FINAL  Final  Culture, Urine     Status: Abnormal   Collection Time: 10/06/20 10:32 AM   Specimen: Urine, Catheterized  Result Value Ref Range Status   Specimen Description   Final    URINE, CATHETERIZED Performed at [facility], [address] [place], [place] [address]    Special Requests   Final    NONE Performed at [facility], [address], [place], [place] [address]    Culture MULTIPLE SPECIES PRESENT, SUGGEST RECOLLECTION (A)  Final   Report Status 10/07/2020 FINAL  Final  Culture, blood (Routine X 2) w Reflex to ID Panel     Status: None   Collection Time: 10/06/20  5:14 PM   Specimen: BLOOD  Result Value Ref Range Status   Specimen Description   Final    BLOOD RIGHT ANTECUBITAL Performed at [facility], [address] [place], [place] [address]    Special Requests   Final    BOTTLES DRAWN AEROBIC ONLY Blood Culture adequate volume Performed at [facility], [address] [place], [place] [address]    Culture   Final    NO GROWTH 5  DAYS Performed at [facility], [address] [place], [place] [address]    Report Status 10/11/2020 FINAL  Final  Culture, blood (Routine X 2) w Reflex to ID Panel     Status: None   Collection Time: 10/06/20  5:14 PM   Specimen: BLOOD  Result Value Ref Range Status   Specimen Description   Final    BLOOD LEFT ANTECUBITAL Performed at [facility], [address] [place], [place] [address]    Special Requests   Final    BOTTLES DRAWN AEROBIC ONLY Blood Culture adequate volume Performed at [facility], [address], [place], [place] [address]    Culture   Final    NO GROWTH 5 DAYS Performed at [facility], [address] [place], [place] [address]    Report Status 10/11/2020 FINAL  Final         Radiology Studies: No results found.      Scheduled Meds: . ARIPiprazole  5 mg Oral Daily  . bisacodyl  10 mg Oral Daily  . buPROPion  300 mg Oral q morning - 10a  . Chlorhexidine Gluconate Cloth  6 each Topical Daily  . fluticasone  1 spray Each Nare Daily  . gabapentin  600 mg Oral BID  . [START ON 10/12/2020] lisdexamfetamine  60 mg Oral q morning - 10a  . OLANZapine  2.5-5 mg Oral QHS  . polyethylene glycol  17 g Oral Daily  . sertraline  200 mg Oral q morning - 10a  . thiamine  100 mg Oral Daily   Or  .  thiamine  100 mg Intravenous Daily   Continuous Infusions: . sodium chloride 75 mL/hr at 10/11/20 0946     LOS: 6 days    Alwyn Ren, MD 10/11/2020, 1:07 PM

## 2020-10-11 NOTE — Plan of Care (Signed)
  Problem: Education: Goal: Knowledge of General Education information will improve Description: Including pain rating scale, medication(s)/side effects and non-pharmacologic comfort measures Outcome: Progressing   Problem: Clinical Measurements: Goal: Will remain free from infection Outcome: Progressing   Problem: Clinical Measurements: Goal: Respiratory complications will improve Outcome: Progressing   

## 2020-10-12 ENCOUNTER — Inpatient Hospital Stay (HOSPITAL_COMMUNITY): Payer: BC Managed Care – PPO

## 2020-10-12 LAB — BASIC METABOLIC PANEL
Anion gap: 11 (ref 5–15)
BUN: <5 mg/dL — ABNORMAL LOW (ref 6–20)
CO2: 25 mmol/L (ref 22–32)
Calcium: 8 mg/dL — ABNORMAL LOW (ref 8.9–10.3)
Chloride: 102 mmol/L (ref 98–111)
Creatinine, Ser: 0.65 mg/dL (ref 0.44–1.00)
GFR, Estimated: >60 mL/min (ref >60)
Glucose, Bld: 115 mg/dL — ABNORMAL HIGH (ref 70–99)
Potassium: 3.3 mmol/L — ABNORMAL LOW (ref 3.5–5.1)
Sodium: 138 mmol/L (ref 135–145)

## 2020-10-12 LAB — CBC
HCT: 32.5 % — ABNORMAL LOW (ref 36.0–46.0)
Hemoglobin: 10.6 g/dL — ABNORMAL LOW (ref 12.0–15.0)
MCH: 31.1 pg (ref 26.0–34.0)
MCHC: 32.6 g/dL (ref 30.0–36.0)
MCV: 95.3 fL (ref 80.0–100.0)
Platelets: 251 10*3/uL (ref 150–400)
RBC: 3.41 MIL/uL — ABNORMAL LOW (ref 3.87–5.11)
RDW: 14.3 % (ref 11.5–15.5)
WBC: 21.1 10*3/uL — ABNORMAL HIGH (ref 4.0–10.5)
nRBC: 0.1 % (ref 0.0–0.2)

## 2020-10-12 MED ORDER — POTASSIUM CHLORIDE CRYS ER 20 MEQ PO TBCR
40.0000 meq | EXTENDED_RELEASE_TABLET | Freq: Two times a day (BID) | ORAL | Status: AC
  Administered 2020-10-12 (×2): 40 meq via ORAL
  Filled 2020-10-12 (×2): qty 2

## 2020-10-12 MED ORDER — IOHEXOL 300 MG/ML  SOLN
100.0000 mL | Freq: Once | INTRAMUSCULAR | Status: AC | PRN
  Administered 2020-10-12: 100 mL via INTRAVENOUS

## 2020-10-12 NOTE — Progress Notes (Signed)
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 This lasted throughout the weekend and she went to an urgent care and given zofran and was directed to the ED. Admits that she drinks 1-2 shots of vodka nightly since the beginning of the pandemic. No abdominal surgeries, no heavy NSAID use. No diarrhea. States that the morphine initially helped her in the ED but now is not really helping her pain. Admits to having an appetite and willing to try to eat.   In the ED, she was afebrile, tachycardic, hypertensive and stable on room air. Notable labs: Lipase 1287, AST 67->58, ALT 50-> 37, T bili 1.8, WBC 18.2->20, Hb 17.1, Lactic acid 1.3-> 2.0, Mg 1.5 this AM. UA contaminated. RUQ US: hepatic steatosis, no cholelithiasis, no acute abnormality. CT abd/pelvis W contrast: acute pancreatitis with a small hypoencancement area of the pancreatic head/uncinate could be edema vs. Early necrosis without peripancreatic fluid collection or walled off necrosis.  Assessment & Plan:   Principal Problem:   Acute alcoholic pancreatitis Active Problems:   Transaminitis   Hypomagnesemia   Alcohol abuse   Depression   Anxiety   Acute lower UTI   Leukocytosis   Hypertension   #1 acute pancreatitis secondary to alcohol abuse.  Lipase levels on admission was 1287 which is down to 38. Patient continues to complain of abdominal pain and abdominal distention. MRI/MRCP 10/08/2020 findings concerning for necrotizing pancreatitis and early pseudocyst. Patient has been on IV fluids normal saline at 150 cc an hour  since admission. Will decrease fluids to 75 cc an hour. GI changed diet to clear liquids from soft diet Encourage her to get out of bed ambulate as much as possible. WBC s trending up  Repeat CT abdomen today  #2 EtOH abuse consult regarding alcohol cessation.  Continue Ativan as needed.  Patient is very anxious. Patient reports she drinks because she has chronic back pain which she cannot tolerate. Advised her to seek help with Ortho/chiropractor/pain management.  #3 depression anxiety continue Abilify.  Continue Wellbutrin and Zyprexa vyvansse  #4 hypokalemia potassium is 3.3 replete potassium.  Magnesium 1.9. Follow-up labs in a.m.  #5 history of asthma patient is actively wheezing today we will start nebulizer treatments.  Estimated body mass index is 31.87 kg/m as calculated from the following:   Height as of this encounter: 6' (1.829 m).   Weight as of this encounter: 106.6 kg.  DVT prophylaxis: Lovenox  code Status: Full code Family Communication: None at bedside Disposition Plan:  Status is: Inpatient  Dispo: The patient is from: Home              Anticipated d/c is to: Home              Anticipated d/c date is 2 day              Patient currently is not medically stable to d/c.    Consultants: None  Procedures: None Antimicrobials: Anti-infectives (From admission, onward)   Start     Dose/Rate Route Frequency Ordered Stop   10/08/20 2200  meropenem (MERREM) 1 g in sodium chloride 0.9 % 100 mL IVPB  Status:  Discontinued        1 g 200 mL/hr over 30 Minutes Intravenous Every 8 hours 10/08/20 2134 10/09/20 1110   10/06/20 1400  meropenem (MERREM) 1 g in sodium chloride 0.9 % 100 mL IVPB  Status:  Discontinued        1 g 200 mL/hr over 30 Minutes Intravenous Every 8 hours 10/06/20 1253 10/08/20 1156       Subjective: Feels worse than yesterday more pain nausea and abdominal pain  Objective: Vitals:   10/11/20 2143 10/12/20 0525 10/12/20 0527 10/12/20 0618   BP: (!) 156/77 (!) 170/105 (!) 166/101 (!) 145/94  Pulse: (!) 101 97  92  Resp: 16 18    Temp: 99 F (37.2 C) 98.4 F (36.9 C)    TempSrc:  Oral    SpO2: 99% 95%    Weight:      Height:        Intake/Output Summary (Last 24 hours) at 10/12/2020 1237 Last data filed at 10/12/2020 0700 Gross per 24 hour  Intake 1452.42 ml  Output --  Net 1452.42 ml   Filed Weights   10/04/20 1454  Weight: 106.6 kg    Examination:  General exam: Appears in mild distress due to pain Respiratory system: Clear to auscultation. Respiratory effort normal. Cardiovascular system: S1 & S2 heard, RRR. No JVD, murmurs, rubs, gallops or clicks. No pedal edema. Gastrointestinal system: Abdomen is distended, soft and increased tender lower abdomen. No organomegaly or masses felt. Normal bowel sounds heard. Central nervous system: Alert and oriented. No focal neurological deficits. Extremities: Symmetric 5 x 5 power. Skin: No rashes, lesions or ulcers Psychiatry: Judgement and insight appear normal. Mood & affect appropriate.     Data Reviewed: I have personally reviewed following labs and imaging studies  CBC: Recent Labs  Lab 10/06/20 0326 10/07/20 0335 10/09/20 0500 10/10/20 1120 10/12/20 0326  WBC 22.3* 17.6* 16.6* 16.7* 21.1*  NEUTROABS  --  13.3*  --   --   --   HGB 13.5 12.0 10.8* 10.3* 10.6*  HCT 42.2 38.0 33.8* 31.2* 32.5*  MCV 99.1 99.7 97.1 94.8 95.3  PLT 156 167 141* 168 251   Basic Metabolic Panel: Recent Labs  Lab 10/06/20 0326 10/06/20 0326 10/06/20 0953 10/07/20 0335 10/09/20 0500 10/10/20 1120 10/12/20 0326  NA 135  --   --  133* 135 137 138  K 5.7*   < > 4.2 3.9 3.3* 3.2* 3.3*  CL 102  --   --  106 104 104 102  CO2 21*  --   --  18* 20* 22 25  GLUCOSE 131*  --   --  97 104* 112* 115*  BUN 16  --   --  14 <5* <5* <5*  CREATININE 1.16*  --   --  0.75 0.57 0.47 0.65  CALCIUM 8.3*  --   --  7.6* 7.6* 7.5* 8.0*  MG  --   --  2.5*  --   --  1.9  --    < > = values  in this interval not displayed.   GFR: Estimated Creatinine Clearance: 136.6 mL/min (by C-G formula based on SCr of 0.65 mg/dL). Liver Function Tests: Recent Labs  Lab 10/06/20 0326 10/07/20 0335 10/09/20 0500 10/10/20 1120  AST 50* 46* 34 21  ALT 33 29 22 18   ALKPHOS 67 78 124 107  BILITOT 1.5* 1.5* 1.1 1.1  PROT 6.2* 5.8* 5.2* 5.1*  ALBUMIN 3.2* 3.0* 2.6* 2.5*  Recent Labs  Lab 10/06/20 0326 10/07/20 0335 10/09/20 0500  LIPASE 379* 128* 38   No results for input(s): AMMONIA in the last 168 hours. Coagulation Profile: No results for input(s): INR, PROTIME in the last 168 hours. Cardiac Enzymes: No results for input(s): CKTOTAL, CKMB, CKMBINDEX, TROPONINI in the last 168 hours. BNP (last 3 results) No results for input(s): PROBNP in the last 8760 hours. HbA1C: No results for input(s): HGBA1C in the last 72 hours. CBG: No results for input(s): GLUCAP in the last 168 hours. Lipid Profile: No results for input(s): CHOL, HDL, LDLCALC, TRIG, CHOLHDL, LDLDIRECT in the last 72 hours. Thyroid Function Tests: No results for input(s): TSH, T4TOTAL, FREET4, T3FREE, THYROIDAB in the last 72 hours. Anemia Panel: No results for input(s): VITAMINB12, FOLATE, FERRITIN, TIBC, IRON, RETICCTPCT in the last 72 hours. Sepsis Labs: Recent Labs  Lab 10/06/20 5462  LATICACIDVEN 1.3    Recent Results (from the past 240 hour(s))  Respiratory Panel by RT PCR (Flu A&B, Covid) - Nasopharyngeal Swab     Status: None   Collection Time: 10/04/20  5:28 PM   Specimen: Nasopharyngeal Swab  Result Value Ref Range Status   SARS Coronavirus 2 by RT PCR NEGATIVE NEGATIVE Final    Comment: (NOTE) SARS-CoV-2 target nucleic acids are NOT DETECTED.  The SARS-CoV-2 RNA is generally detectable in upper respiratoy specimens during the acute phase of infection. The lowest concentration of SARS-CoV-2 viral copies this assay can detect is 131 copies/mL. A negative result does not preclude  SARS-Cov-2 infection and should not be used as the sole basis for treatment or other patient management decisions. A negative result may occur with  improper specimen collection/handling, submission of specimen other than nasopharyngeal swab, presence of viral mutation(s) within the areas targeted by this assay, and inadequate number of viral copies (<131 copies/mL). A negative result must be combined with clinical observations, patient history, and epidemiological information. The expected result is Negative.  Fact Sheet for Patients:  https://www.moore.com/  Fact Sheet for Healthcare Providers:  https://www.young.biz/  This test is no t yet approved or cleared by the [place] FDA and  has been authorized for detection and/or diagnosis of SARS-CoV-2 by FDA under an Emergency Use Authorization (EUA). This EUA will remain  in effect (meaning this test can be used) for the duration of the COVID-19 declaration under Section 564(b)(1) of the Act, 21 U.S.C. section 360bbb-3(b)(1), unless the authorization is terminated or revoked sooner.     Influenza A by PCR NEGATIVE NEGATIVE Final   Influenza B by PCR NEGATIVE NEGATIVE Final    Comment: (NOTE) The Xpert Xpress SARS-CoV-2/FLU/RSV assay is intended as an aid in  the diagnosis of influenza from Nasopharyngeal swab specimens and  should not be used as a sole basis for treatment. Nasal washings and  aspirates are unacceptable for Xpert Xpress SARS-CoV-2/FLU/RSV  testing.  Fact Sheet for Patients: https://www.moore.com/  Fact Sheet for Healthcare Providers: https://www.young.biz/  This test is not yet approved or cleared by the [place] FDA and  has been authorized for detection and/or diagnosis of SARS-CoV-2 by  FDA under an Emergency Use Authorization (EUA). This EUA will remain  in effect (meaning this test can be used) for the duration of the   Covid-19 declaration under Section 564(b)(1) of the Act, 21  U.S.C. section 360bbb-3(b)(1), unless the authorization is  terminated or revoked. Performed at [facility], [address] [place], [place] [address]   Urine culture  Status: Abnormal   Collection Time: 10/04/20  6:00 PM   Specimen: Urine, Random  Result Value Ref Range Status   Specimen Description   Final    URINE, RANDOM Performed at [facility], [address] [place], [place] [address]    Special Requests   Final    NONE Performed at [facility], [address] [place], [place] [address]    Culture MULTIPLE SPECIES PRESENT, SUGGEST RECOLLECTION (A)  Final   Report Status 10/05/2020 FINAL  Final  Culture, Urine     Status: Abnormal   Collection Time: 10/06/20 10:32 AM   Specimen: Urine, Catheterized  Result Value Ref Range Status   Specimen Description   Final    URINE, CATHETERIZED Performed at [facility], [address], [place], [place] [address]    Special Requests   Final    NONE Performed at [facility], [address] [place], [place] [address]    Culture MULTIPLE SPECIES PRESENT, SUGGEST RECOLLECTION (A)  Final   Report Status 10/07/2020 FINAL  Final  Culture, blood (Routine X 2) w Reflex to ID Panel     Status: None   Collection Time: 10/06/20  5:14 PM   Specimen: BLOOD  Result Value Ref Range Status   Specimen Description   Final    BLOOD RIGHT ANTECUBITAL Performed at [facility], [address], [place], [place] [address]    Special Requests   Final    BOTTLES DRAWN AEROBIC ONLY Blood Culture adequate volume Performed at [facility], [address] [place], [place] [address]    Culture   Final    NO GROWTH 5 DAYS Performed at [facility], [address], [place], [place] [address]    Report Status 10/11/2020 FINAL  Final  Culture, blood (Routine X 2) w  Reflex to ID Panel     Status: None   Collection Time: 10/06/20  5:14 PM   Specimen: BLOOD  Result Value Ref Range Status   Specimen Description   Final    BLOOD LEFT ANTECUBITAL Performed at [facility], [address] [place], [place] [address]    Special Requests   Final    BOTTLES DRAWN AEROBIC ONLY Blood Culture adequate volume Performed at [facility], [address] [place], [place] [address]    Culture   Final    NO GROWTH 5 DAYS Performed at [facility], [address] [place], [place] [address]    Report Status 10/11/2020 FINAL  Final         Radiology Studies: CT ABDOMEN PELVIS W CONTRAST  Result Date: 10/12/2020 CLINICAL DATA:  Necrotizing pancreatitis.  Diffuse abdominal pain. EXAM: CT ABDOMEN AND PELVIS WITH CONTRAST TECHNIQUE: Multidetector CT imaging of the abdomen and pelvis was performed using the standard protocol following bolus administration of intravenous contrast. CONTRAST:  OMNIPAQUE IOHEXOL 300 MG/ML  SOLN COMPARISON:  MR abdomen 10/08/2020 and CT abdomen pelvis 10/04/2020. FINDINGS: Lower chest: Bilateral pleural effusions, small on the right and moderate on the left. Collapse/consolidation in the lingula and left lower lobe. Subsegmental atelectasis in the right middle and right lower lobes. All these findings are new from 10/04/2020. Heart is at the upper limits of normal in size to mildly enlarged. No pericardial effusion. Distal esophagus is grossly unremarkable. Hepatobiliary: Liver is decreased in attenuation diffusely. Liver and gallbladder are otherwise unremarkable. No biliary  ductal dilatation. Pancreas: Diffusely heterogeneous gland with a large amount of peripancreatic fluid and stranding, similar to minimally increased from 10/08/2020. Fluid extends inferiorly along left paracolic gutter. Peripancreatic lymph nodes measure up to 10 mm (2/46). Spleen: Enlarged, measuring 14.3 cm.  Otherwise  unremarkable. Adrenals/Urinary Tract: Adrenal glands and kidneys are unremarkable. Ureters are decompressed. Bladder is grossly unremarkable. Stomach/Bowel: Stomach, small bowel, appendix and colon are unremarkable. Vascular/Lymphatic: Splenic vein may be slightly attenuated but is grossly patent. Vascular structures are otherwise unremarkable. Peripancreatic lymph nodes measure up to 10 mm. Gastrohepatic ligament lymph nodes measure up to 8 mm. Reproductive: Intrauterine contraceptive device.  No adnexal mass. Other: Moderate ascites. Musculoskeletal: Left sacroiliac joint fusion. No worrisome lytic or sclerotic lesions. IMPRESSION: 1. Necrotizing pancreatitis with a large amount of peripancreatic fluid, similar to increased from 10/08/2020. 2. Splenic vein is attenuated but grossly patent, as on recent MR 10/08/2020. 3. Hepatic steatosis.  Splenomegaly. 4. Bilateral pleural effusions, left greater than right, with collapse/consolidation in the lingula and left lower lobe. Superimposed pneumonia is not excluded. 5. Moderate ascites. Electronically Signed   By: Leanna Battles M.D.   On: 10/12/2020 11:05        Scheduled Meds: . ARIPiprazole  5 mg Oral Daily  . bisacodyl  10 mg Oral Daily  . buPROPion  300 mg Oral q morning - 10a  . Chlorhexidine Gluconate Cloth  6 each Topical Daily  . fluticasone  1 spray Each Nare Daily  . gabapentin  600 mg Oral BID  . lisdexamfetamine  60 mg Oral q morning - 10a  . OLANZapine  2.5-5 mg Oral QHS  . polyethylene glycol  17 g Oral Daily  . potassium chloride  40 mEq Oral BID  . sertraline  200 mg Oral q morning - 10a  . thiamine  100 mg Oral Daily   Or  . thiamine  100 mg Intravenous Daily   Continuous Infusions: . sodium chloride 75 mL/hr at 10/12/20 0333     LOS: 7 days    Alwyn Ren, MD 10/12/2020, 12:37 PM

## 2020-10-12 NOTE — Progress Notes (Signed)
[facility] Progress Note  Wallace Cogliano 34 y.o. 09/11/1986   Subjective: Denies worsened abd pain with eating last evening. Complaining of pain this morning.  Objective: Vital signs: Vitals:   10/12/20 0527 10/12/20 0618  BP: (!) 166/101 (!) 145/94  Pulse:  92  Resp:    Temp:    SpO2:    T 98.4  Physical Exam: Gen: alert, no acute distress, obese HEENT: anicteric sclera CV: RRR Chest: CTA B Abd: diffuse tenderness with guarding, soft, nondistended, +BS Ext: no edema  Lab Results: Recent Labs    10/10/20 1120 10/12/20 0326  NA 137 138  K 3.2* 3.3*  CL 104 102  CO2 22 25  GLUCOSE 112* 115*  BUN <5* <5*  CREATININE 0.47 0.65  CALCIUM 7.5* 8.0*  MG 1.9  --    Recent Labs    10/10/20 1120  AST 21  ALT 18  ALKPHOS 107  BILITOT 1.1  PROT 5.1*  ALBUMIN 2.5*   Recent Labs    10/10/20 1120 10/12/20 0326  WBC 16.7* 21.1*  HGB 10.3* 10.6*  HCT 31.2* 32.5*  MCV 94.8 95.3  PLT 168 251      Assessment/Plan: Acute necrotizing pancreatitis likely due to alcohol. WBC rising. Will do an updated CT scan with IV contrast to look for pseudocyst formation and evolution of the necrosis. Considered repeat MRI but will do CT if adequate study can be obtained without oral contrast. NPO until CT and then may need to change diet to clear liquids. Continue supportive care. D/W Dr. Jerolyn Center.   [name] 10/12/2020, 9:22 AM  Questions please call [phone] ID: Cloyd Stagers, female   DOB: 09-06-1986, 34 y.o.   MRN: [id number]

## 2020-10-13 LAB — COMPREHENSIVE METABOLIC PANEL
ALT: 9 U/L (ref 0–44)
AST: 22 U/L (ref 15–41)
Albumin: 2.5 g/dL — ABNORMAL LOW (ref 3.5–5.0)
Alkaline Phosphatase: 111 U/L (ref 38–126)
Anion gap: 11 (ref 5–15)
BUN: <4 mg/dL — ABNORMAL LOW (ref 6–20)
CO2: 25 mmol/L (ref 22–32)
Calcium: 8.3 mg/dL — ABNORMAL LOW (ref 8.9–10.3)
Chloride: 102 mmol/L (ref 98–111)
Creatinine, Ser: 0.66 mg/dL (ref 0.44–1.00)
GFR, Estimated: >60 mL/min (ref >60)
Glucose, Bld: 90 mg/dL (ref 70–99)
Potassium: 3.8 mmol/L (ref 3.5–5.1)
Sodium: 138 mmol/L (ref 135–145)
Total Bilirubin: 0.9 mg/dL (ref 0.3–1.2)
Total Protein: 5.6 g/dL — ABNORMAL LOW (ref 6.5–8.1)

## 2020-10-13 LAB — CBC
HCT: 32.9 % — ABNORMAL LOW (ref 36.0–46.0)
Hemoglobin: 10.8 g/dL — ABNORMAL LOW (ref 12.0–15.0)
MCH: 31.1 pg (ref 26.0–34.0)
MCHC: 32.8 g/dL (ref 30.0–36.0)
MCV: 94.8 fL (ref 80.0–100.0)
Platelets: 296 10*3/uL (ref 150–400)
RBC: 3.47 MIL/uL — ABNORMAL LOW (ref 3.87–5.11)
RDW: 14.2 % (ref 11.5–15.5)
WBC: 20.6 10*3/uL — ABNORMAL HIGH (ref 4.0–10.5)
nRBC: 0 % (ref 0.0–0.2)

## 2020-10-13 MED ORDER — SACCHAROMYCES BOULARDII 250 MG PO CAPS
250.0000 mg | ORAL_CAPSULE | Freq: Two times a day (BID) | ORAL | Status: DC
  Administered 2020-10-13 – 2020-10-15 (×5): 250 mg via ORAL
  Filled 2020-10-13 (×6): qty 1

## 2020-10-13 MED ORDER — HYDROMORPHONE HCL 2 MG PO TABS
1.0000 mg | ORAL_TABLET | ORAL | Status: DC | PRN
  Administered 2020-10-13 – 2020-10-15 (×11): 1 mg via ORAL
  Filled 2020-10-13 (×11): qty 1

## 2020-10-13 MED ORDER — ALBUTEROL SULFATE (2.5 MG/3ML) 0.083% IN NEBU
2.5000 mg | INHALATION_SOLUTION | Freq: Four times a day (QID) | RESPIRATORY_TRACT | Status: DC
  Administered 2020-10-13: 2.5 mg via RESPIRATORY_TRACT
  Filled 2020-10-13: qty 3

## 2020-10-13 MED ORDER — ALBUTEROL SULFATE (2.5 MG/3ML) 0.083% IN NEBU
2.5000 mg | INHALATION_SOLUTION | Freq: Three times a day (TID) | RESPIRATORY_TRACT | Status: DC
  Administered 2020-10-13 – 2020-10-15 (×5): 2.5 mg via RESPIRATORY_TRACT
  Filled 2020-10-13 (×5): qty 3

## 2020-10-13 MED ORDER — LOPERAMIDE HCL 2 MG PO CAPS
2.0000 mg | ORAL_CAPSULE | Freq: Four times a day (QID) | ORAL | Status: DC | PRN
  Administered 2020-10-13 – 2020-10-14 (×2): 4 mg via ORAL
  Filled 2020-10-13 (×2): qty 2

## 2020-10-13 NOTE — Plan of Care (Signed)
Plan of care reviewed and discussed with the patient. 

## 2020-10-13 NOTE — Progress Notes (Signed)
Patient having loose BMs. MD aware. Imodium ordered.

## 2020-10-13 NOTE — Progress Notes (Signed)
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 This lasted throughout the weekend and she went to an urgent care and given zofran and was directed to the ED. Admits that she drinks 1-2 shots of vodka nightly since the beginning of the pandemic. No abdominal surgeries, no heavy NSAID use. No diarrhea. States that the morphine initially helped her in the ED but now is not really helping her pain. Admits to having an appetite and willing to try to eat.   She teaches at [employer].  In the ED, she was afebrile, tachycardic, hypertensive and stable on room air. Notable labs: Lipase 1287, AST 67->58, ALT 50-> 37, T bili 1.8, WBC 18.2->20, Hb 17.1, Lactic acid 1.3-> 2.0, Mg 1.5 this AM. UA contaminated. RUQ [place]: hepatic steatosis, no cholelithiasis, no acute abnormality. CT abd/pelvis W contrast: acute pancreatitis with a small hypoencancement area of the pancreatic head/uncinate could be edema vs. Early necrosis without peripancreatic fluid collection or walled off necrosis.  Assessment & Plan:   Principal Problem:   Acute alcoholic pancreatitis Active Problems:   Transaminitis   Hypomagnesemia   Alcohol abuse   Depression   Anxiety   Acute lower UTI   Leukocytosis   Hypertension   #1 acute pancreatitis secondary to alcohol abuse.  Lipase levels on admission was 1287 which is down to 38. Patient continues to complain of abdominal pain and abdominal distention. MRI/MRCP 10/08/2020 findings concerning for necrotizing pancreatitis and early pseudocyst. Patient has been on IV  fluids normal saline at 150 cc an hour since admission. Decreased fluids to 75 cc an hour. GI changed diet to clear liquids from soft diet Encourage her to get out of bed ambulate as much as possible. WBC s still  up  Repeat CT abdomen 11/8-Necrotizing pancreatitis with a large amount of peripancreatic fluid, similar to increased from 10/08/2020.  Splenic vein is attenuated but grossly patent, as on recent MR 10/08/2020.Hepatic steatosis.  Splenomegaly.  Bilateral pleural effusions, left greater than right, with collapse/consolidation in the lingula and left lower lobe. Superimposed pneumonia is not excluded.  Moderate ascites.  #2 EtOH abuse counseled  regarding alcohol cessation.  Continue Ativan as needed.  Patient is very anxious. Patient reports she drinks because she has chronic back pain which she cannot tolerate. Advised her to seek help with Ortho/chiropractor/pain management.  #3 depression anxiety continue Abilify.  Continue Wellbutrin and Zyprexa vyvansse  #4 hypokalemia -resolved   #5 history of asthma patient is actively wheezing today we will start nebulizer treatments.  #6  HCAP left lower lobe pneumonia- CT of the abdomen with consolidation of the left lower lobe.  Patient complaining of new onset cough.  With new onset cough and leukocytosis and wheezing I will start her on cefepime to cover HCAP.  Estimated body mass index is 31.87 kg/m as calculated from the following:   Height as of this encounter: 6' (1.829 m).   Weight as of this encounter: 106.6 kg.  DVT prophylaxis: Lovenox  code Status: Full code Family Communication: None at bedside Disposition Plan:  Status is: Inpatient  Dispo: The patient is from: Home  Anticipated d/c is to: Home              Anticipated d/c date is more than 3 days              Patient currently is not medically stable to d/c.    Consultants: None  Procedures: None Antimicrobials: Anti-infectives (From admission,  onward)   Start     Dose/Rate Route Frequency Ordered Stop   10/08/20 2200  meropenem (MERREM) 1 g in sodium chloride 0.9 % 100 mL IVPB  Status:  Discontinued        1 g 200 mL/hr over 30 Minutes Intravenous Every 8 hours 10/08/20 2134 10/09/20 1110   10/06/20 1400  meropenem (MERREM) 1 g in sodium chloride 0.9 % 100 mL IVPB  Status:  Discontinued        1 g 200 mL/hr over 30 Minutes Intravenous Every 8 hours 10/06/20 1253 10/08/20 1156       Subjective: Patient continues to feel worse still on clear liquids abdomen distended complains of a cough and wheezing still with leukocytosis  Objective: Vitals:   10/12/20 1259 10/12/20 2126 10/13/20 0607 10/13/20 1202  BP: (!) 125/96 (!) 141/94 (!) 137/95   Pulse: 97 (!) 108 93   Resp: 20 16 16    Temp: 98 F (36.7 C) 98.7 F (37.1 C) 98.8 F (37.1 C)   TempSrc: Oral Oral Oral   SpO2: 95% 94% 96% 99%  Weight:      Height:        Intake/Output Summary (Last 24 hours) at 10/13/2020 1338 Last data filed at 10/13/2020 0603 Gross per 24 hour  Intake 2020.22 ml  Output --  Net 2020.22 ml   Filed Weights   10/04/20 1454  Weight: 106.6 kg    Examination:  General exam: Appears in mild distress due to pain Respiratory system: Clear to auscultation. Respiratory effort normal. Cardiovascular system: S1 & S2 heard, RRR. No JVD, murmurs, rubs, gallops or clicks. No pedal edema. Gastrointestinal system: Abdomen is distended, soft and increased tender lower abdomen. No organomegaly or masses felt. Normal bowel sounds heard. Central nervous system: Alert and oriented. No focal neurological deficits. Extremities: Symmetric 5 x 5 power. Skin: No rashes, lesions or ulcers Psychiatry: Judgement and insight appear normal. Mood & affect appropriate.     Data Reviewed: I have personally reviewed following labs and imaging studies  CBC: Recent Labs  Lab 10/07/20 0335 10/09/20 0500 10/10/20 1120 10/12/20 0326 10/13/20 0844  WBC 17.6*  16.6* 16.7* 21.1* 20.6*  NEUTROABS 13.3*  --   --   --   --   HGB 12.0 10.8* 10.3* 10.6* 10.8*  HCT 38.0 33.8* 31.2* 32.5* 32.9*  MCV 99.7 97.1 94.8 95.3 94.8  PLT 167 141* 168 251 296   Basic Metabolic Panel: Recent Labs  Lab 10/09/20 0500 10/10/20 1120 10/12/20 0326 10/13/20 0844 10/13/20 0847  NA 135 137 138 DUPLICATE 138  K 3.3* 3.2* 3.3* DUPLICATE 3.8  CL 104 104 102 DUPLICATE 102  CO2 20* 22 25 DUPLICATE 25  GLUCOSE 104* 112* 115* DUPLICATE 90  BUN <5* <5* <5* DUPLICATE <4*  CREATININE 0.57 0.47 0.65 DUPLICATE 0.66  CALCIUM 7.6* 7.5* 8.0* DUPLICATE 8.3*  MG  --  1.9  --   --   --    GFR: Estimated Creatinine Clearance: 136.6 mL/min (by C-G formula based on SCr of 0.66 mg/dL). Liver Function Tests: Recent Labs  Lab 10/07/20 0335 10/09/20 0500 10/10/20 1120 10/13/20 0844 10/13/20  0847  AST 46* 34 21 DUPLICATE 22  ALT 29 22 18  DUPLICATE 9  ALKPHOS 78 124 107 DUPLICATE 111  BILITOT 1.5* 1.1 1.1 DUPLICATE 0.9  PROT 5.8* 5.2* 5.1* DUPLICATE 5.6*  ALBUMIN 3.0* 2.6* 2.5* DUPLICATE 2.5*   Recent Labs  Lab 10/07/20 0335 10/09/20 0500  LIPASE 128* 38   No results for input(s): AMMONIA in the last 168 hours. Coagulation Profile: No results for input(s): INR, PROTIME in the last 168 hours. Cardiac Enzymes: No results for input(s): CKTOTAL, CKMB, CKMBINDEX, TROPONINI in the last 168 hours. BNP (last 3 results) No results for input(s): PROBNP in the last 8760 hours. HbA1C: No results for input(s): HGBA1C in the last 72 hours. CBG: No results for input(s): GLUCAP in the last 168 hours. Lipid Profile: No results for input(s): CHOL, HDL, LDLCALC, TRIG, CHOLHDL, LDLDIRECT in the last 72 hours. Thyroid Function Tests: No results for input(s): TSH, T4TOTAL, FREET4, T3FREE, THYROIDAB in the last 72 hours. Anemia Panel: No results for input(s): VITAMINB12, FOLATE, FERRITIN, TIBC, IRON, RETICCTPCT in the last 72 hours. Sepsis Labs: No results for input(s): PROCALCITON,  LATICACIDVEN in the last 168 hours.  Recent Results (from the past 240 hour(s))  Respiratory Panel by RT PCR (Flu A&B, Covid) - Nasopharyngeal Swab     Status: None   Collection Time: 10/04/20  5:28 PM   Specimen: Nasopharyngeal Swab  Result Value Ref Range Status   SARS Coronavirus 2 by RT PCR NEGATIVE NEGATIVE Final    Comment: (NOTE) SARS-CoV-2 target nucleic acids are NOT DETECTED.  The SARS-CoV-2 RNA is generally detectable in upper respiratoy specimens during the acute phase of infection. The lowest concentration of SARS-CoV-2 viral copies this assay can detect is 131 copies/mL. A negative result does not preclude SARS-Cov-2 infection and should not be used as the sole basis for treatment or other patient management decisions. A negative result may occur with  improper specimen collection/handling, submission of specimen other than nasopharyngeal swab, presence of viral mutation(s) within the areas targeted by this assay, and inadequate number of viral copies (<131 copies/mL). A negative result must be combined with clinical observations, patient history, and epidemiological information. The expected result is Negative.  Fact Sheet for Patients:  10/06/20  Fact Sheet for Healthcare Providers:  https://www.moore.com/  This test is no t yet approved or cleared by the https://www.young.biz/ FDA and  has been authorized for detection and/or diagnosis of SARS-CoV-2 by FDA under an Emergency Use Authorization (EUA). This EUA will remain  in effect (meaning this test can be used) for the duration of the COVID-19 declaration under Section 564(b)(1) of the Act, 21 U.S.C. section 360bbb-3(b)(1), unless the authorization is terminated or revoked sooner.     Influenza A by PCR NEGATIVE NEGATIVE Final   Influenza B by PCR NEGATIVE NEGATIVE Final    Comment: (NOTE) The Xpert Xpress SARS-CoV-2/FLU/RSV assay is intended as an aid in  the  diagnosis of influenza from Nasopharyngeal swab specimens and  should not be used as a sole basis for treatment. Nasal washings and  aspirates are unacceptable for Xpert Xpress SARS-CoV-2/FLU/RSV  testing.  Fact Sheet for Patients: [place]  Fact Sheet for Healthcare Providers: https://www.moore.com/  This test is not yet approved or cleared by the https://www.young.biz/ FDA and  has been authorized for detection and/or diagnosis of SARS-CoV-2 by  FDA under an Emergency Use Authorization (EUA). This EUA will remain  in effect (meaning this test can be used) for the duration of the  Covid-19 declaration under Section 564(b)(1) of the Act, 21  U.S.C. section 360bbb-3(b)(1), unless the authorization is  terminated or revoked. Performed at [facility], [address] [place], [place] [address]   Urine culture     Status: Abnormal   Collection Time: 10/04/20  6:00 PM   Specimen: Urine, Random  Result Value Ref Range Status   Specimen Description   Final    URINE, RANDOM Performed at [facility], [address] [place] , [place] [address]    Special Requests   Final    NONE Performed at [facility], [address] [place], [place] [address]    Culture MULTIPLE SPECIES PRESENT, SUGGEST RECOLLECTION (A)  Final   Report Status 10/05/2020 FINAL  Final  Culture, Urine     Status: Abnormal   Collection Time: 10/06/20 10:32 AM   Specimen: Urine, Catheterized  Result Value Ref Range Status   Specimen Description   Final    URINE, CATHETERIZED Performed at [facility], [address] [place], [place] [address]    Special Requests   Final    NONE Performed at [facility], [address], [place], [place] [address]    Culture MULTIPLE SPECIES PRESENT, SUGGEST RECOLLECTION (A)  Final   Report Status 10/07/2020 FINAL  Final  Culture, blood (Routine X 2) w Reflex to  ID Panel     Status: None   Collection Time: 10/06/20  5:14 PM   Specimen: BLOOD  Result Value Ref Range Status   Specimen Description   Final    BLOOD RIGHT ANTECUBITAL Performed at [facility], [address] [place], [place] [address]    Special Requests   Final    BOTTLES DRAWN AEROBIC ONLY Blood Culture adequate volume Performed at [facility], [address] [place], [place] [address]    Culture   Final    NO GROWTH 5 DAYS Performed at [facility], [address], [place], [place] [address]    Report Status 10/11/2020 FINAL  Final  Culture, blood (Routine X 2) w Reflex to ID Panel     Status: None   Collection Time: 10/06/20  5:14 PM   Specimen: BLOOD  Result Value Ref Range Status   Specimen Description   Final    BLOOD LEFT ANTECUBITAL Performed at [facility], [address], [place], [place] [address]    Special Requests   Final    BOTTLES DRAWN AEROBIC ONLY Blood Culture adequate volume Performed at [facility], [address] [place], [place] [address]    Culture   Final    NO GROWTH 5 DAYS Performed at [facility], [address], [place], [place] [address]    Report Status 10/11/2020 FINAL  Final         Radiology Studies: CT ABDOMEN PELVIS W CONTRAST  Result Date: 10/12/2020 CLINICAL DATA:  Necrotizing pancreatitis.  Diffuse abdominal pain. EXAM: CT ABDOMEN AND PELVIS WITH CONTRAST TECHNIQUE: Multidetector CT imaging of the abdomen and pelvis was performed using the standard protocol following bolus administration of intravenous contrast. CONTRAST:  OMNIPAQUE IOHEXOL 300 MG/ML  SOLN COMPARISON:  MR abdomen 10/08/2020 and CT abdomen pelvis 10/04/2020. FINDINGS: Lower chest: Bilateral pleural effusions, small on the right and moderate on the left. Collapse/consolidation in the lingula and left lower lobe. Subsegmental atelectasis in the right middle and right  lower  lobes. All these findings are new from 10/04/2020. Heart is at the upper limits of normal in size to mildly enlarged. No pericardial effusion. Distal esophagus is grossly unremarkable. Hepatobiliary: Liver is decreased in attenuation diffusely. Liver and gallbladder are otherwise unremarkable. No biliary ductal dilatation. Pancreas: Diffusely heterogeneous gland with a large amount of peripancreatic fluid and stranding, similar to minimally increased from 10/08/2020. Fluid extends inferiorly along left paracolic gutter. Peripancreatic lymph nodes measure up to 10 mm (2/46). Spleen: Enlarged, measuring 14.3 cm.  Otherwise unremarkable. Adrenals/Urinary Tract: Adrenal glands and kidneys are unremarkable. Ureters are decompressed. Bladder is grossly unremarkable. Stomach/Bowel: Stomach, small bowel, appendix and colon are unremarkable. Vascular/Lymphatic: Splenic vein may be slightly attenuated but is grossly patent. Vascular structures are otherwise unremarkable. Peripancreatic lymph nodes measure up to 10 mm. Gastrohepatic ligament lymph nodes measure up to 8 mm. Reproductive: Intrauterine contraceptive device.  No adnexal mass. Other: Moderate ascites. Musculoskeletal: Left sacroiliac joint fusion. No worrisome lytic or sclerotic lesions. IMPRESSION: 1. Necrotizing pancreatitis with a large amount of peripancreatic fluid, similar to increased from 10/08/2020. 2. Splenic vein is attenuated but grossly patent, as on recent MR 10/08/2020. 3. Hepatic steatosis.  Splenomegaly. 4. Bilateral pleural effusions, left greater than right, with collapse/consolidation in the lingula and left lower lobe. Superimposed pneumonia is not excluded. 5. Moderate ascites. Electronically Signed   By: Leanna Battles M.D.   On: 10/12/2020 11:05        Scheduled Meds: . albuterol  2.5 mg Nebulization QID  . ARIPiprazole  5 mg Oral Daily  . bisacodyl  10 mg Oral Daily  . buPROPion  300 mg Oral q morning - 10a  .  Chlorhexidine Gluconate Cloth  6 each Topical Daily  . fluticasone  1 spray Each Nare Daily  . gabapentin  600 mg Oral BID  . lisdexamfetamine  60 mg Oral q morning - 10a  . OLANZapine  2.5-5 mg Oral QHS  . polyethylene glycol  17 g Oral Daily  . saccharomyces boulardii  250 mg Oral BID  . sertraline  200 mg Oral q morning - 10a  . thiamine  100 mg Oral Daily   Or  . thiamine  100 mg Intravenous Daily   Continuous Infusions: . sodium chloride 75 mL/hr at 10/13/20 0603     LOS: 8 days    Alwyn Ren, MD 10/13/2020, 1:38 PM

## 2020-10-14 DIAGNOSIS — F3289 Other specified depressive episodes: Secondary | ICD-10-CM

## 2020-10-14 LAB — CBC WITH DIFFERENTIAL/PLATELET
Abs Immature Granulocytes: 0.36 10*3/uL — ABNORMAL HIGH (ref 0.00–0.07)
Basophils Absolute: 0.1 10*3/uL (ref 0.0–0.1)
Basophils Relative: 0 %
Eosinophils Absolute: 0.3 10*3/uL (ref 0.0–0.5)
Eosinophils Relative: 2 %
HCT: 31.8 % — ABNORMAL LOW (ref 36.0–46.0)
Hemoglobin: 10.3 g/dL — ABNORMAL LOW (ref 12.0–15.0)
Immature Granulocytes: 2 %
Lymphocytes Relative: 10 %
Lymphs Abs: 1.5 10*3/uL (ref 0.7–4.0)
MCH: 30.8 pg (ref 26.0–34.0)
MCHC: 32.4 g/dL (ref 30.0–36.0)
MCV: 95.2 fL (ref 80.0–100.0)
Monocytes Absolute: 0.8 10*3/uL (ref 0.1–1.0)
Monocytes Relative: 5 %
Neutro Abs: 12.7 10*3/uL — ABNORMAL HIGH (ref 1.7–7.7)
Neutrophils Relative %: 81 %
Platelets: 304 10*3/uL (ref 150–400)
RBC: 3.34 MIL/uL — ABNORMAL LOW (ref 3.87–5.11)
RDW: 14.2 % (ref 11.5–15.5)
WBC: 15.7 10*3/uL — ABNORMAL HIGH (ref 4.0–10.5)
nRBC: 0 % (ref 0.0–0.2)

## 2020-10-14 LAB — COMPREHENSIVE METABOLIC PANEL
ALT: 13 U/L (ref 0–44)
AST: 21 U/L (ref 15–41)
Albumin: 2.7 g/dL — ABNORMAL LOW (ref 3.5–5.0)
Alkaline Phosphatase: 87 U/L (ref 38–126)
Anion gap: 12 (ref 5–15)
BUN: <5 mg/dL — ABNORMAL LOW (ref 6–20)
CO2: 25 mmol/L (ref 22–32)
Calcium: 7.9 mg/dL — ABNORMAL LOW (ref 8.9–10.3)
Chloride: 101 mmol/L (ref 98–111)
Creatinine, Ser: 0.69 mg/dL (ref 0.44–1.00)
GFR, Estimated: >60 mL/min (ref >60)
Glucose, Bld: 107 mg/dL — ABNORMAL HIGH (ref 70–99)
Potassium: 3.8 mmol/L (ref 3.5–5.1)
Sodium: 138 mmol/L (ref 135–145)
Total Bilirubin: 1 mg/dL (ref 0.3–1.2)
Total Protein: 5.7 g/dL — ABNORMAL LOW (ref 6.5–8.1)

## 2020-10-14 LAB — MAGNESIUM: Magnesium: 1.8 mg/dL (ref 1.7–2.4)

## 2020-10-14 LAB — PHOSPHORUS: Phosphorus: 3.9 mg/dL (ref 2.5–4.6)

## 2020-10-14 LAB — LIPASE, BLOOD: Lipase: 47 U/L (ref 11–51)

## 2020-10-14 MED ORDER — MAGNESIUM SULFATE 2 GM/50ML IV SOLN
2.0000 g | Freq: Once | INTRAVENOUS | Status: AC
  Administered 2020-10-14: 2 g via INTRAVENOUS
  Filled 2020-10-14: qty 50

## 2020-10-14 NOTE — Progress Notes (Signed)
PROGRESS NOTE    Jilliane Kazanjian  WNI:[id number] DOB: 04/22/1986 DOA: 10/04/2020 PCP: Patient, No Pcp Per    Brief Narrative:  34 year old female with history of anxiety, depression, chronic pain syndrome, gastroparesis, migraine and alcohol use presented [facility] with abdominal pain associated with nausea vomiting chills.  In the emergency room, afebrile.  Tachycardic.  Hypertensive.  On room air.  Lipase 1287, mild elevated LFTs.  Right upper quadrant ultrasound with hepatic steatosis but no cholelithiasis.  CT scan abdomen pelvis with contrast consistent with acute pancreatitis with a small hypoenhancement area of the pancreatic head and uncinate process.   Assessment & Plan:   Principal Problem:   Acute alcoholic pancreatitis Active Problems:   Transaminitis   Hypomagnesemia   Alcohol abuse   Depression   Anxiety   Acute lower UTI   Leukocytosis   Hypertension  Acute metabolic secondary to alcohol use: Presented with pancreatitis, initially treated.  Worsening course and MRI on 11/4 was concerning for necrotizing pancreatitis and early pseudocyst.  Followed by GI.  Repeat CT scan on 11/8 with necrotizing pancreatitis with a large amount of peripancreatic fluid. Symptomatically improving.  Tolerating clears. Continue adequate pain medications.  Advance to full liquid diet.  Close monitoring of electrolytes and lipase.  Alcohol use: Drinks about 1-2 shots of vodka every day.  Also has a history of anxiety and depression.  She is on Abilify, Wellbutrin and Zyprexa that she will continue.  On as needed Ativan.  On multivitamins.  Counseling done and patient is motivated.  Hypokalemia: Replaced and improved.  Left lower lobe pneumonia: Suspect atelectasis.  She was briefly on antibiotics and now discontinued.   DVT prophylaxis: SCDs Start: 10/04/20 1957   Code Status: Full code Family Communication: None.  Friend at the bedside. Disposition Plan: Status is:  Inpatient  Remains inpatient appropriate because:Inpatient level of care appropriate due to severity of illness   Dispo: The patient is from: Home              Anticipated d/c is to: Home              Anticipated d/c date is: 2 days              Patient currently is not medically stable to d/c.         Consultants:   Gastroenterology  Procedures:   None  Antimicrobials:  Anti-infectives (From admission, onward)   Start     Dose/Rate Route Frequency Ordered Stop   10/08/20 2200  meropenem (MERREM) 1 g in sodium chloride 0.9 % 100 mL IVPB  Status:  Discontinued        1 g 200 mL/hr over 30 Minutes Intravenous Every 8 hours 10/08/20 2134 10/09/20 1110   10/06/20 1400  meropenem (MERREM) 1 g in sodium chloride 0.9 % 100 mL IVPB  Status:  Discontinued        1 g 200 mL/hr over 30 Minutes Intravenous Every 8 hours 10/06/20 1253 10/08/20 1156         Subjective: Patient seen and examined.  No overnight events.  Mild pain on palpation of the abdomen.  Denies any nausea vomiting.  Going to bathroom and having looser stools 2 or 3 a day.  Ready to eat some solid food. Friend at bedside.  Multiple questions answered.  Objective: Vitals:   10/14/20 0331 10/14/20 0633 10/14/20 0751 10/14/20 1442  BP: (!) 144/95 (!) 136/94  114/83  Pulse: 100 (!) 103  Marland Kitchen)  108  Resp: 16 16  18   Temp: 99 F (37.2 C) 98.2 F (36.8 C)  99 F (37.2 C)  TempSrc: Oral Oral  Oral  SpO2: 95% 94% 97% 91%  Weight:      Height:        Intake/Output Summary (Last 24 hours) at 10/14/2020 1514 Last data filed at 10/14/2020 1153 Gross per 24 hour  Intake 1209.15 ml  Output --  Net 1209.15 ml   Filed Weights   10/04/20 1454  Weight: 106.6 kg    Examination:  General exam: Appears calm and comfortable, not in any acute distress. Respiratory system: Clear to auscultation. Respiratory effort normal. Cardiovascular system: S1 & S2 heard, RRR. No JVD, murmurs, rubs, gallops or clicks. No pedal  edema. Gastrointestinal system: Mildly distended, no rigidity or guarding.  Mild pain around epigastrium and both flanks. Central nervous system: Alert and oriented. No focal neurological deficits. Extremities: Symmetric 5 x 5 power. Skin: No rashes, lesions or ulcers Psychiatry: Judgement and insight appear normal. Mood & affect appropriate.     Data Reviewed: I have personally reviewed following labs and imaging studies  CBC: Recent Labs  Lab 10/09/20 0500 10/10/20 1120 10/12/20 0326 10/13/20 0844 10/14/20 1158  WBC 16.6* 16.7* 21.1* 20.6* 15.7*  NEUTROABS  --   --   --   --  12.7*  HGB 10.8* 10.3* 10.6* 10.8* 10.3*  HCT 33.8* 31.2* 32.5* 32.9* 31.8*  MCV 97.1 94.8 95.3 94.8 95.2  PLT 141* 168 251 296 304   Basic Metabolic Panel: Recent Labs  Lab 10/10/20 1120 10/12/20 0326 10/13/20 0844 10/13/20 0847 10/14/20 1158  NA 137 138 DUPLICATE 138 138  K 3.2* 3.3* DUPLICATE 3.8 3.8  CL 104 102 DUPLICATE 102 101  CO2 22 25 DUPLICATE 25 25  GLUCOSE 112* 115* DUPLICATE 90 107*  BUN <5* <5* DUPLICATE <4* <5*  CREATININE 0.47 0.65 DUPLICATE 0.66 0.69  CALCIUM 7.5* 8.0* DUPLICATE 8.3* 7.9*  MG 1.9  --   --   --  1.8  PHOS  --   --   --   --  3.9   GFR: Estimated Creatinine Clearance: 136.6 mL/min (by C-G formula based on SCr of 0.69 mg/dL). Liver Function Tests: Recent Labs  Lab 10/09/20 0500 10/10/20 1120 10/13/20 0844 10/13/20 0847 10/14/20 1158  AST 34 21 DUPLICATE 22 21  ALT 22 18 DUPLICATE 9 13  ALKPHOS 124 107 DUPLICATE 111 87  BILITOT 1.1 1.1 DUPLICATE 0.9 1.0  PROT 5.2* 5.1* DUPLICATE 5.6* 5.7*  ALBUMIN 2.6* 2.5* DUPLICATE 2.5* 2.7*   Recent Labs  Lab 10/09/20 0500 10/14/20 1158  LIPASE 38 47   No results for input(s): AMMONIA in the last 168 hours. Coagulation Profile: No results for input(s): INR, PROTIME in the last 168 hours. Cardiac Enzymes: No results for input(s): CKTOTAL, CKMB, CKMBINDEX, TROPONINI in the last 168 hours. BNP (last 3  results) No results for input(s): PROBNP in the last 8760 hours. HbA1C: No results for input(s): HGBA1C in the last 72 hours. CBG: No results for input(s): GLUCAP in the last 168 hours. Lipid Profile: No results for input(s): CHOL, HDL, LDLCALC, TRIG, CHOLHDL, LDLDIRECT in the last 72 hours. Thyroid Function Tests: No results for input(s): TSH, T4TOTAL, FREET4, T3FREE, THYROIDAB in the last 72 hours. Anemia Panel: No results for input(s): VITAMINB12, FOLATE, FERRITIN, TIBC, IRON, RETICCTPCT in the last 72 hours. Sepsis Labs: No results for input(s): PROCALCITON, LATICACIDVEN in the last 168 hours.  Recent Results (from the  past 240 hour(s))  Respiratory Panel by RT PCR (Flu A&B, Covid) - Nasopharyngeal Swab     Status: None   Collection Time: 10/04/20  5:28 PM   Specimen: Nasopharyngeal Swab  Result Value Ref Range Status   SARS Coronavirus 2 by RT PCR NEGATIVE NEGATIVE Final    Comment: (NOTE) SARS-CoV-2 target nucleic acids are NOT DETECTED.  The SARS-CoV-2 RNA is generally detectable in upper respiratoy specimens during the acute phase of infection. The lowest concentration of SARS-CoV-2 viral copies this assay can detect is 131 copies/mL. A negative result does not preclude SARS-Cov-2 infection and should not be used as the sole basis for treatment or other patient management decisions. A negative result may occur with  improper specimen collection/handling, submission of specimen other than nasopharyngeal swab, presence of viral mutation(s) within the areas targeted by this assay, and inadequate number of viral copies (<131 copies/mL). A negative result must be combined with clinical observations, patient history, and epidemiological information. The expected result is Negative.  Fact Sheet for Patients:  https://www.moore.com/https://www.fda.gov/media/142436/download  Fact Sheet for Healthcare Providers:  https://www.young.biz/https://www.fda.gov/media/142435/download  This test is no t yet approved or cleared  by the [place] FDA and  has been authorized for detection and/or diagnosis of SARS-CoV-2 by FDA under an Emergency Use Authorization (EUA). This EUA will remain  in effect (meaning this test can be used) for the duration of the COVID-19 declaration under Section 564(b)(1) of the Act, 21 U.S.C. section 360bbb-3(b)(1), unless the authorization is terminated or revoked sooner.     Influenza A by PCR NEGATIVE NEGATIVE Final   Influenza B by PCR NEGATIVE NEGATIVE Final    Comment: (NOTE) The Xpert Xpress SARS-CoV-2/FLU/RSV assay is intended as an aid in  the diagnosis of influenza from Nasopharyngeal swab specimens and  should not be used as a sole basis for treatment. Nasal washings and  aspirates are unacceptable for Xpert Xpress SARS-CoV-2/FLU/RSV  testing.  Fact Sheet for Patients: https://www.moore.com/https://www.fda.gov/media/142436/download  Fact Sheet for Healthcare Providers: https://www.young.biz/https://www.fda.gov/media/142435/download  This test is not yet approved or cleared by the [place] FDA and  has been authorized for detection and/or diagnosis of SARS-CoV-2 by  FDA under an Emergency Use Authorization (EUA). This EUA will remain  in effect (meaning this test can be used) for the duration of the  Covid-19 declaration under Section 564(b)(1) of the Act, 21  U.S.C. section 360bbb-3(b)(1), unless the authorization is  terminated or revoked. Performed at [facility] [place], [address] [place], [place] [address]   Urine culture     Status: Abnormal   Collection Time: 10/04/20  6:00 PM   Specimen: Urine, Random  Result Value Ref Range Status   Specimen Description   Final    URINE, RANDOM Performed at [facility] [place], [address] [place], [place] [address]    Special Requests   Final    NONE Performed at [facility], [address] [place], [place] [address]    Culture MULTIPLE SPECIES PRESENT, SUGGEST RECOLLECTION (A)  Final   Report Status 10/05/2020 FINAL   Final  Culture, Urine     Status: Abnormal   Collection Time: 10/06/20 10:32 AM   Specimen: Urine, Catheterized  Result Value Ref Range Status   Specimen Description   Final    URINE, CATHETERIZED Performed at [facility], [address] [place], [place] [address]    Special Requests   Final    NONE Performed at  [facility], [address] [place], [place] [address]    Culture MULTIPLE SPECIES PRESENT, SUGGEST RECOLLECTION (A)  Final   Report Status 10/07/2020 FINAL  Final  Culture, blood (Routine X 2) w Reflex to ID Panel     Status: None   Collection Time: 10/06/20  5:14 PM   Specimen: BLOOD  Result Value Ref Range Status   Specimen Description   Final    BLOOD RIGHT ANTECUBITAL Performed at [facility], [address], [place], [place] [address]    Special Requests   Final    BOTTLES DRAWN AEROBIC ONLY Blood Culture adequate volume Performed at [facility], [address], [place], [place] [address]    Culture   Final    NO GROWTH 5 DAYS Performed at [facility], [address] [place], [place] [address]    Report Status 10/11/2020 FINAL  Final  Culture, blood (Routine X 2) w Reflex to ID Panel     Status: None   Collection Time: 10/06/20  5:14 PM   Specimen: BLOOD  Result Value Ref Range Status   Specimen Description   Final    BLOOD LEFT ANTECUBITAL Performed at [facility], [address], [place], [place] [address]    Special Requests   Final    BOTTLES DRAWN AEROBIC ONLY Blood Culture adequate volume Performed at [facility], [address] [place], [place] [address]    Culture   Final    NO GROWTH 5 DAYS Performed at [facility], [address] [place], [place] [address]    Report Status 10/11/2020 FINAL  Final         Radiology Studies: No results found.      Scheduled Meds: . albuterol  2.5 mg Nebulization  TID  . ARIPiprazole  5 mg Oral Daily  . bisacodyl  10 mg Oral Daily  . buPROPion  300 mg Oral q morning - 10a  . Chlorhexidine Gluconate Cloth  6 each Topical Daily  . fluticasone  1 spray Each Nare Daily  . gabapentin  600 mg Oral BID  . lisdexamfetamine  60 mg Oral q morning - 10a  . OLANZapine  2.5-5 mg Oral QHS  . polyethylene glycol  17 g Oral Daily  . saccharomyces boulardii  250 mg Oral BID  . sertraline  200 mg Oral q morning - 10a  . thiamine  100 mg Oral Daily   Or  . thiamine  100 mg Intravenous Daily   Continuous Infusions: . sodium chloride 75 mL/hr at 10/14/20 0836     LOS: 9 days    Time spent: 30 minutes    Dorcas Carrow, MD [facility] Pager [phone]

## 2020-10-14 NOTE — Progress Notes (Signed)
Nutrition Education Note  RD consulted for nutrition education regarding pancreatitis  RD provided "Pancreatitis Nutrition Therapy" handout from the [employer] of [employer]. Reviewed patient's dietary recall. Provided examples of low fat and fiber foods. Discouraged intake of processed foods and alcohol. Teach back method used.  Expect good compliance. Pt took notes and was very engaged in education. RD asked if pt wanted a protein supplement, felt she was feeling better and did not need one at this time. Pt with insignificant weight loss. Told pt to request supplement from unit if she changes her mind.  Body mass index is 31.87 kg/m. Pt meets criteria for obesity based on current BMI.  Current diet order is full liquids. Labs and medications reviewed. No further nutrition interventions warranted at this time. RD contact information provided. If additional nutrition issues arise, please re-consult RD.   Tilda Franco, MS, RD, LDN Inpatient Clinical Dietitian Contact information available via Amion

## 2020-10-15 LAB — COMPREHENSIVE METABOLIC PANEL
ALT: 12 U/L (ref 0–44)
AST: 20 U/L (ref 15–41)
Albumin: 2.7 g/dL — ABNORMAL LOW (ref 3.5–5.0)
Alkaline Phosphatase: 85 U/L (ref 38–126)
Anion gap: 8 (ref 5–15)
BUN: <5 mg/dL — ABNORMAL LOW (ref 6–20)
CO2: 27 mmol/L (ref 22–32)
Calcium: 8 mg/dL — ABNORMAL LOW (ref 8.9–10.3)
Chloride: 103 mmol/L (ref 98–111)
Creatinine, Ser: 0.69 mg/dL (ref 0.44–1.00)
GFR, Estimated: >60 mL/min (ref >60)
Glucose, Bld: 111 mg/dL — ABNORMAL HIGH (ref 70–99)
Potassium: 3.8 mmol/L (ref 3.5–5.1)
Sodium: 138 mmol/L (ref 135–145)
Total Bilirubin: 0.5 mg/dL (ref 0.3–1.2)
Total Protein: 5.8 g/dL — ABNORMAL LOW (ref 6.5–8.1)

## 2020-10-15 LAB — CBC WITH DIFFERENTIAL/PLATELET
Abs Immature Granulocytes: 0.32 10*3/uL — ABNORMAL HIGH (ref 0.00–0.07)
Basophils Absolute: 0.1 10*3/uL (ref 0.0–0.1)
Basophils Relative: 1 %
Eosinophils Absolute: 0.3 10*3/uL (ref 0.0–0.5)
Eosinophils Relative: 2 %
HCT: 32.5 % — ABNORMAL LOW (ref 36.0–46.0)
Hemoglobin: 10.5 g/dL — ABNORMAL LOW (ref 12.0–15.0)
Immature Granulocytes: 2 %
Lymphocytes Relative: 16 %
Lymphs Abs: 2.1 10*3/uL (ref 0.7–4.0)
MCH: 31.1 pg (ref 26.0–34.0)
MCHC: 32.3 g/dL (ref 30.0–36.0)
MCV: 96.2 fL (ref 80.0–100.0)
Monocytes Absolute: 0.9 10*3/uL (ref 0.1–1.0)
Monocytes Relative: 7 %
Neutro Abs: 9.5 10*3/uL — ABNORMAL HIGH (ref 1.7–7.7)
Neutrophils Relative %: 72 %
Platelets: 391 10*3/uL (ref 150–400)
RBC: 3.38 MIL/uL — ABNORMAL LOW (ref 3.87–5.11)
RDW: 14.2 % (ref 11.5–15.5)
WBC: 13.3 10*3/uL — ABNORMAL HIGH (ref 4.0–10.5)
nRBC: 0 % (ref 0.0–0.2)

## 2020-10-15 LAB — PHOSPHORUS: Phosphorus: 4.1 mg/dL (ref 2.5–4.6)

## 2020-10-15 LAB — MAGNESIUM: Magnesium: 2.4 mg/dL (ref 1.7–2.4)

## 2020-10-15 MED ORDER — HYDROMORPHONE HCL 2 MG PO TABS: 1.0000 mg | ORAL_TABLET | ORAL | 0 refills | Status: AC | PRN

## 2020-10-15 MED ORDER — PANCRELIPASE (LIP-PROT-AMYL) 36000-114000 UNITS PO CPEP
ORAL_CAPSULE | ORAL | 11 refills | Status: AC
Start: 2020-10-15 — End: ?

## 2020-10-15 MED ORDER — HYDROXYZINE HCL 25 MG PO TABS: 25.0000 mg | ORAL_TABLET | Freq: Three times a day (TID) | ORAL | Status: DC | PRN

## 2020-10-15 MED ORDER — PANCRELIPASE (LIP-PROT-AMYL) 12000-38000 UNITS PO CPEP: 24000.0000 [IU] | ORAL_CAPSULE | Freq: Three times a day (TID) | ORAL | Status: DC

## 2020-10-15 MED ORDER — ONDANSETRON 4 MG PO TBDP: 4.0000 mg | ORAL_TABLET | Freq: Three times a day (TID) | ORAL | 0 refills | Status: AC | PRN

## 2020-10-15 NOTE — Discharge Summary (Signed)
Physician Discharge Summary  Michelle Braun ZOX:[id number] DOB: 01-Aug-1986 DOA: 10/04/2020  PCP: Patient, No Pcp Per  Admit date: 10/04/2020 Discharge date: 10/15/2020  Admitted From: Home Disposition: Home  Recommendations for Outpatient Follow-up:  1. Follow up with PCP in 1-2 weeks 2. Please obtain BMP/CBC in one week 3. Follow-up with gastroenterology. 4. Schedule follow-up with your PCP.  Home Health: Not applicable Equipment/Devices: Not applicable  Discharge Condition: Stable CODE STATUS: Full code Diet recommendation: Full liquid, soft diet.  No alcohol.  Discharge summary: 34 year old female with history of anxiety, depression, chronic pain syndrome and gastroparesis, migraine who drinks about 1-2 drinks of vodka every day presented to [facility] with abdominal pain associated with nausea, vomiting and chills.  In the emergency room she was tachycardic otherwise hemodynamically stable.  Lipase was 1287 and LFTs were mildly elevated.  Right upper quadrant ultrasound with no cholelithiasis.  CT scan abdomen pelvis with contrast consistent with acute pancreatitis.  Admit to the hospital.  Hospital course complicated by prolonged ileus, abdominal pain, development of pancreatic necrosis.  Today stabilized and discharging home.  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 11/4, MRI with necrotizing pancreatitis and early pseudocyst 11/8, repeat CT scan with necrotizing pancreatitis and large amount of peripancreatic fluid Treated with symptomatic management, IV fluids, pain medications and bowel rest with adequate improvement. Currently tolerating full liquid diet and soft diet with no nausea vomiting.  Has normal bowel movement, liquid stool.  Abdomen pain is managed with oral pain medications including oral Dilaudid.  Electrolytes were replaced and adequate.  Plan: Patient with adequate improvement, normal bowel  function and oral intake, she will be able to go home today.  She will stay on full liquid diet until all her symptoms have improved. Patient is able to manage her pain on oral pain medications, she will be prescribed 3 to 5 days of oral opiates, oral Dilaudid.  As needed Zofran. Complete abstinence from alcohol and patient is agreeable.  No evidence of alcohol withdrawal. Patient is on multiple mental health medications, followed by telemetry psychiatry and appropriately on Abilify, Wellbutrin, Zyprexa that she will continue.  QTC was 430.  Discharge Diagnoses:  Principal Problem:   Acute alcoholic pancreatitis Active Problems:   Transaminitis   Hypomagnesemia   Alcohol abuse   Depression   Anxiety   Acute lower UTI   Leukocytosis   Hypertension    Discharge Instructions  Discharge Instructions    Call MD for:  persistant nausea and vomiting   Complete by: As directed    Call MD for:  severe uncontrolled pain   Complete by: As directed    Diet - low sodium heart healthy   Complete by: As directed    Stay on full liquid diet until belly pain is improved   Diet full liquid   Complete by: As directed    Increase activity slowly   Complete by: As directed      Allergies as of 10/15/2020      Reactions   Tramadol    anaphylaxis      Medication List    STOP taking these medications   aspirin EC 81 MG tablet   atenolol 50 MG tablet Commonly known as: TENORMIN   metoCLOPramide 10 MG tablet Commonly known as: REGLAN   predniSONE 20 MG tablet Commonly known as: DELTASONE     TAKE these medications   albuterol 108 (90 Base) MCG/ACT inhaler Commonly known as: VENTOLIN HFA  Inhale 2 puffs into the lungs every 4 (four) hours as needed for wheezing or shortness of breath.   ARIPiprazole 5 MG tablet Commonly known as: ABILIFY Take 5 mg by mouth daily.   buPROPion 300 MG 24 hr tablet Commonly known as: WELLBUTRIN XL Take 300 mg by mouth every morning.   EpiPen 0.3  mg/0.3 mL Soaj injection Generic drug: EPINEPHrine Inject 0.3 mg into the muscle once as needed (allergic reaction.).   gabapentin 600 MG tablet Commonly known as: NEURONTIN Take 600 mg by mouth in the morning and at bedtime.   guaiFENesin 600 MG 12 hr tablet Commonly known as: MUCINEX Take 600 mg by mouth 2 (two) times daily as needed for cough or to loosen phlegm.   HYDROmorphone 2 MG tablet Commonly known as: DILAUDID Take 0.5 tablets (1 mg total) by mouth every 3 (three) hours as needed for up to 4 days for severe pain.   levonorgestrel 20 MCG/24HR IUD Commonly known as: MIRENA 1 each by Intrauterine route once.   lipase/protease/amylase 40981 UNITS Cpep capsule Commonly known as: Creon Take 1 capsule (36,000 Units total) by mouth 3 (three) times daily with meals AND 1 capsule (36,000 Units total) 3 (three) times daily before meals.   mometasone 50 MCG/ACT nasal spray Commonly known as: NASONEX Place 2 sprays into the nose as needed (nasal congestion).   montelukast 10 MG tablet Commonly known as: SINGULAIR Take 10 mg by mouth at bedtime.   OLANZapine 5 MG tablet Commonly known as: ZYPREXA Take 2.5-5 mg by mouth at bedtime.   ondansetron 4 MG disintegrating tablet Commonly known as: Zofran ODT Take 1 tablet (4 mg total) by mouth every 8 (eight) hours as needed for nausea or vomiting.   phenylephrine 10 MG Tabs tablet Commonly known as: SUDAFED PE Take 10 mg by mouth every 4 (four) hours as needed (congestion).   sertraline 100 MG tablet Commonly known as: ZOLOFT Take 200 mg by mouth every morning.   Vyvanse 60 MG capsule Generic drug: lisdexamfetamine Take 60 mg by mouth every morning.       Follow-up Information    Koirala, Dibas, MD Follow up in 1 week(s).   Specialty: Family Medicine Why: as scheduled for 11/14  Contact information: [address] [place] [address] [phone]        Willis Modena, MD. Schedule an  appointment as soon as possible for a visit in 3 week(s).   Specialty: Gastroenterology Why: Follow-up for acute necrotizing pancreatitis Contact information: [address] [place] [address] [phone]              Allergies  Allergen Reactions  . Tramadol     anaphylaxis    Consultations:  Gastroenterology   Procedures/Studies: CT ABDOMEN PELVIS W CONTRAST  Result Date: 10/12/2020 CLINICAL DATA:  Necrotizing pancreatitis.  Diffuse abdominal pain. EXAM: CT ABDOMEN AND PELVIS WITH CONTRAST TECHNIQUE: Multidetector CT imaging of the abdomen and pelvis was performed using the standard protocol following bolus administration of intravenous contrast. CONTRAST:  OMNIPAQUE IOHEXOL 300 MG/ML  SOLN COMPARISON:  MR abdomen 10/08/2020 and CT abdomen pelvis 10/04/2020. FINDINGS: Lower chest: Bilateral pleural effusions, small on the right and moderate on the left. Collapse/consolidation in the lingula and left lower lobe. Subsegmental atelectasis in the right middle and right lower lobes. All these findings are new from 10/04/2020. Heart is at the upper limits of normal in size to mildly enlarged. No pericardial effusion. Distal esophagus is  grossly unremarkable. Hepatobiliary: Liver is decreased in attenuation diffusely. Liver and gallbladder are otherwise unremarkable. No biliary ductal dilatation. Pancreas: Diffusely heterogeneous gland with a large amount of peripancreatic fluid and stranding, similar to minimally increased from 10/08/2020. Fluid extends inferiorly along left paracolic gutter. Peripancreatic lymph nodes measure up to 10 mm (2/46). Spleen: Enlarged, measuring 14.3 cm.  Otherwise unremarkable. Adrenals/Urinary Tract: Adrenal glands and kidneys are unremarkable. Ureters are decompressed. Bladder is grossly unremarkable. Stomach/Bowel: Stomach, small bowel, appendix and colon are unremarkable. Vascular/Lymphatic: Splenic vein may be slightly attenuated but  is grossly patent. Vascular structures are otherwise unremarkable. Peripancreatic lymph nodes measure up to 10 mm. Gastrohepatic ligament lymph nodes measure up to 8 mm. Reproductive: Intrauterine contraceptive device.  No adnexal mass. Other: Moderate ascites. Musculoskeletal: Left sacroiliac joint fusion. No worrisome lytic or sclerotic lesions. IMPRESSION: 1. Necrotizing pancreatitis with a large amount of peripancreatic fluid, similar to increased from 10/08/2020. 2. Splenic vein is attenuated but grossly patent, as on recent MR 10/08/2020. 3. Hepatic steatosis.  Splenomegaly. 4. Bilateral pleural effusions, left greater than right, with collapse/consolidation in the lingula and left lower lobe. Superimposed pneumonia is not excluded. 5. Moderate ascites. Electronically Signed   By: Leanna Battles M.D.   On: 10/12/2020 11:05   CT ABDOMEN PELVIS W CONTRAST  Result Date: 10/04/2020 CLINICAL DATA:  Acute pancreatitis. EXAM: CT ABDOMEN AND PELVIS WITH CONTRAST TECHNIQUE: Multidetector CT imaging of the abdomen and pelvis was performed using the standard protocol following bolus administration of intravenous contrast. CONTRAST:  OMNIPAQUE IOHEXOL 300 MG/ML  SOLN COMPARISON:  Same day abdominal ultrasound and CT abdomen pelvis dated 07/30/2014. FINDINGS: Lower chest: No acute abnormality. Hepatobiliary: The liver is hypoattenuating relative to the spleen, consistent with hepatic steatosis. No focal liver abnormality is seen. No gallstones, gallbladder wall thickening, or biliary dilatation. Pancreas: There is diffuse edema of the pancreas with significant surrounding fat stranding and edema. The pancreas parenchyma appears to enhance homogeneously aside from a 1.3 cm area of relative hypoattenuation in the pancreatic head/uncinate (series 2, image 40). There is no acute peripancreatic fluid collection or walled off necrosis. Fluid extends into the peritoneal cavity along the bilateral pericolic gutters and  into the pelvis. No pancreatic ductal dilatation. Spleen: Normal in size without focal abnormality. Adrenals/Urinary Tract: Adrenal glands are unremarkable. Kidneys are normal, without renal calculi, focal lesion, or hydronephrosis. Bladder is decompressed. Stomach/Bowel: Stomach is within normal limits. Appendix appears normal. No evidence of bowel wall thickening, distention, or inflammatory changes. Vascular/Lymphatic: No significant vascular findings are present. No portal vein thrombosis or splenic artery aneurysm is identified. No enlarged abdominal or pelvic lymph nodes. Reproductive: An intrauterine contraceptive device is noted. Other: No abdominal wall hernia is seen. Musculoskeletal: Fixation hardware traverses the left sacroiliac joint. IMPRESSION: Acute pancreatitis. A small area of the pancreatic head/uncinate enhances less than the remainder of the pancreatic parenchyma and may represent hypoenhancement secondary only to edema versus early necrosis. No acute peripancreatic fluid collection or walled off necrosis. Electronically Signed   By: Romona Curls M.D.   On: 10/04/2020 19:15   MR 3D Recon At Scanner  Result Date: 10/08/2020 CLINICAL DATA:  34 year old female with reported history of pancreatitis, found at pancreatitis on recent CT evaluation. EXAM: MRI ABDOMEN WITHOUT AND WITH CONTRAST (INCLUDING MRCP) TECHNIQUE: Multiplanar multisequence MR imaging of the abdomen was performed both before and after the administration of intravenous contrast. Heavily T2-weighted images of the biliary and pancreatic ducts were obtained, and three-dimensional MRCP images were rendered  by post processing. CONTRAST:  56mL GADAVIST GADOBUTROL 1 MMOL/ML IV SOLN COMPARISON:  CT study from 10/04/2020 FINDINGS: Lower chest: Interval development of bilateral pleural effusions, trace on the RIGHT and small on the LEFT with associated basilar airspace disease. Airspace process not well evaluated on MRI. Hepatobiliary:  Severe hepatic steatosis. No focal, suspicious hepatic lesion. Gallbladder moderately distended. No pericholecystic stranding. No biliary duct dilation. No filling defect in the common bile duct. 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 Interval development of acute pancreatic collections with hemorrhagic and/or proteinaceous features adjacent to the head and neck of the pancreas extending into the hepatic gastric recess of the lesser sac measuring 7.9 x 5.3 cm. Smaller amounts of fluid track into the central mesentery. Also with acute pancreatic collections adjacent to the tail tracking towards the LEFT subdiaphragmatic region between thew spleen and the stomach above the pancreatic tail. Scattered areas of acute pancreatic fluid in the anterior pararenal space on the LEFT. Small volume ascites. Some mass effect upon the splenic portal confluence. Highly narrowed splenic vein. Patent portal vein and SMV. Spleen: Spleen without focal lesion with mild-to-moderate enlargement approximately 14 cm greatest craniocaudal dimension. Adrenals/Urinary Tract: Adrenal glands are normal. Symmetric renal enhancement. No sign of hydronephrosis no suspicious renal lesion. Stomach/Bowel: Bowel edema and gastric edema in the setting of severe pancreatitis. 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 Other:  Small volume ascites and body wall edema. Musculoskeletal: No suspicious bone lesions identified. IMPRESSION: 1. 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 Also displaying marked worsening of stigmata of pancreatitis with peripancreatic collections some  with hemorrhagic or proteinaceous features. 2. 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 3. Interval development of bilateral pleural effusions, trace on the RIGHT and small on the LEFT with associated basilar airspace disease. 4. Edema of the stomach and adjacent small bowel. 5. No findings of choledocholithiasis.  No biliary duct dilation. 6. Small volume ascites and body wall edema. 7. Severe hepatic steatosis. These results will be called to the ordering clinician or representative by the Radiologist Assistant, and communication documented in the PACS or [employer]. Electronically Signed   By: Donzetta Kohut M.D.   On: 10/08/2020 20:56   MR ABDOMEN MRCP W WO CONTAST  Result Date: 10/08/2020 CLINICAL DATA:  34 year old female with reported history of pancreatitis, found at pancreatitis on recent CT evaluation. EXAM: MRI ABDOMEN WITHOUT AND WITH CONTRAST (INCLUDING MRCP) TECHNIQUE: Multiplanar multisequence MR imaging of the abdomen was performed both before and after the administration of intravenous contrast. Heavily T2-weighted images of the biliary and pancreatic ducts were obtained, and three-dimensional MRCP images were rendered by post processing. CONTRAST:  42mL GADAVIST GADOBUTROL 1 MMOL/ML IV SOLN COMPARISON:  CT study from 10/04/2020 FINDINGS: Lower chest: Interval development of bilateral pleural effusions, trace on the RIGHT and small on the LEFT with associated basilar airspace disease. Airspace process not well evaluated on MRI. Hepatobiliary: Severe hepatic steatosis. No focal, suspicious hepatic lesion. Gallbladder moderately distended. No pericholecystic stranding. No biliary duct dilation. No filling defect in the common bile duct. Pancreas: Interval development of pancreatic necrosis involving greater than 50% of  the gland. Interval development of acute pancreatic collections with hemorrhagic and/or proteinaceous features adjacent to the  head and neck of the pancreas extending into the hepatic gastric recess of the lesser sac measuring 7.9 x 5.3 cm. Smaller amounts of fluid track into the central mesentery. Also with acute pancreatic collections adjacent to the tail tracking towards the LEFT subdiaphragmatic region between thew spleen and the stomach above the pancreatic tail. Scattered areas of acute pancreatic fluid in the anterior pararenal space on the LEFT. Small volume ascites. Some mass effect upon the splenic portal confluence. Highly narrowed splenic vein. Patent portal vein and SMV. Spleen: Spleen without focal lesion with mild-to-moderate enlargement approximately 14 cm greatest craniocaudal dimension. Adrenals/Urinary Tract: Adrenal glands are normal. Symmetric renal enhancement. No sign of hydronephrosis no suspicious renal lesion. Stomach/Bowel: Bowel edema and gastric edema in the setting of severe pancreatitis. 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 Other:  Small volume ascites and body wall edema. Musculoskeletal: No suspicious bone lesions identified. IMPRESSION: 1. 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 Also displaying marked worsening of stigmata of pancreatitis with peripancreatic collections some with hemorrhagic or proteinaceous features. 2. 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 3. Interval development of bilateral pleural effusions, trace on the RIGHT and small on the LEFT with associated basilar airspace disease. 4. Edema of the stomach and adjacent small bowel. 5. No findings of choledocholithiasis.  No biliary duct dilation. 6. Small volume  ascites and body wall edema. 7. Severe hepatic steatosis. These results will be called to the ordering clinician or representative by the Radiologist Assistant, and communication documented in the PACS or [employer]. Electronically Signed   By: Donzetta Kohut M.D.   On: 10/08/2020 20:56   [place] EKG SITE RITE  Result Date: 10/06/2020 If Site Rite image not attached, placement could not be confirmed due to current cardiac rhythm.  US Abdomen Limited RUQ (LIVER/GB)  Result Date: 10/04/2020 CLINICAL DATA:  Pancreatitis and upper abdominal pain EXAM: ULTRASOUND ABDOMEN LIMITED RIGHT UPPER QUADRANT COMPARISON:  None. FINDINGS: Gallbladder: No gallstones or wall thickening visualized. No sonographic Murphy sign noted by sonographer. Common bile duct: Diameter: Not well visualized. Liver: Diffuse increased echogenicity with slightly heterogeneous liver. Appearance typically secondary to fatty infiltration. Fibrosis secondary consideration. No secondary findings of cirrhosis noted. No focal hepatic lesion or intrahepatic biliary duct dilatation. Portal vein is patent on color Doppler imaging with normal direction of blood flow towards the liver. Other: None. IMPRESSION: 1. No acute abnormality.  No evidence for cholelithiasis. 2. Hepatic steatosis. Electronically Signed   By: Katherine Mantle M.D.   On: 10/04/2020 17:23   (Echo, Carotid, EGD, Colonoscopy, ERCP)    Subjective: Patient was seen and examined.  No overnight events.  She had used 4 to 5 tablets of oral Dilaudid 1 mg over last 24 hours.  Denies any nausea vomiting.  She was able to eat full liquid diet.  She has liquid runny stool 1 or 2 a day.  Minimal abdominal pain mostly on palpation mobility.   Discharge Exam: Vitals:   10/15/20 0550 10/15/20 0920  BP: (!) 144/99   Pulse: 94   Resp:  16   Temp: 98.4 F (36.9 C)   SpO2: (!) 89% 98%   Vitals:   10/14/20 1831 10/14/20 2141 10/15/20 0550 10/15/20 0920  BP:  (!) 143/83 (!) 144/99    Pulse:  (!) 106 94   Resp:  18 16   Temp:  99.1 F (37.3 C) 98.4 F (36.9 C)   TempSrc:      SpO2: 93% 95% (!) 89% 98%  Weight:      Height:        General: Pt is alert, awake, not in acute distress, looks comfortable. Cardiovascular: RRR, S1/S2 +, no rubs, no gallops Respiratory: CTA bilaterally, no wheezing, no rhonchi Abdominal: Soft, distended.  Mild tenderness on deep palpation all quadrants mostly on the epigastrium.  No rigidity or guarding. Extremities: no edema, no cyanosis    The results of significant diagnostics from this hospitalization (including imaging, microbiology, ancillary and laboratory) are listed below for reference.     Microbiology: Recent Results (from the past 240 hour(s))  Culture, Urine     Status: Abnormal   Collection Time: 10/06/20 10:32 AM   Specimen: Urine, Catheterized  Result Value Ref Range Status   Specimen Description   Final    URINE, CATHETERIZED Performed at [facility], [address] [place], [place] [address]    Special Requests   Final    NONE Performed at [facility], [address] [place], [place] [address]    Culture MULTIPLE SPECIES PRESENT, SUGGEST RECOLLECTION (A)  Final   Report Status 10/07/2020 FINAL  Final  Culture, blood (Routine X 2) w Reflex to ID Panel     Status: None   Collection Time: 10/06/20  5:14 PM   Specimen: BLOOD  Result Value Ref Range Status   Specimen Description   Final    BLOOD RIGHT ANTECUBITAL Performed at [facility], [address], [place], [place] [address]    Special Requests   Final    BOTTLES DRAWN AEROBIC ONLY Blood Culture adequate volume Performed at [facility], [address] [place], [place] [address]    Culture   Final    NO GROWTH 5 DAYS Performed at [facility], [address] [place], [place] [address]    Report Status 10/11/2020 FINAL  Final  Culture, blood (Routine X 2) w  Reflex to ID Panel     Status: None   Collection Time: 10/06/20  5:14 PM   Specimen: BLOOD  Result Value Ref Range Status   Specimen Description   Final    BLOOD LEFT ANTECUBITAL Performed at [facility] [place] [facility], [address] [place], [place] [address]    Special Requests   Final    BOTTLES DRAWN AEROBIC ONLY Blood Culture adequate volume Performed at [facility], [address], [place], [place] [address]    Culture   Final    NO GROWTH 5 DAYS Performed at [facility], [address], [place], [place] [address]    Report Status 10/11/2020 FINAL  Final     Labs: BNP (last 3 results) No results for input(s): BNP in the last 8760 hours. Basic Metabolic Panel: Recent Labs  Lab 10/10/20 1120 10/10/20 1120 10/12/20 0326 10/13/20 0844 10/13/20 0847 10/14/20 1158 10/15/20 0412  NA 137   < > 138 DUPLICATE 138 138 138  K 3.2*   < > 3.3* DUPLICATE 3.8 3.8 3.8  CL 104   < >  102 DUPLICATE 102 101 103  CO2 22   < > 25 DUPLICATE GLUCOSE 112*   < > 115* DUPLICATE 90 107* 111*  BUN <5*   < > <5* DUPLICATE <4* <5* <5*  CREATININE 0.47   < > 0.65 DUPLICATE 0.66 0.69 0.69  CALCIUM 7.5*   < > 8.0* DUPLICATE 8.3* 7.9* 8.0*  MG 1.9  --   --   --   --  1.8 2.4  PHOS  --   --   --   --   --  3.9 4.1   < > = values in this interval not displayed.   Liver Function Tests: Recent Labs  Lab 10/10/20 1120 10/13/20 0844 10/13/20 0847 10/14/20 1158 10/15/20 0412  AST 21 DUPLICATE ALT 18 DUPLICATE ALKPHOS 107 DUPLICATE 111 87 85  BILITOT 1.1 DUPLICATE 0.9 1.0 0.5  PROT 5.1* DUPLICATE 5.6* 5.7* 5.8*  ALBUMIN 2.5* DUPLICATE 2.5* 2.7* 2.7*   Recent Labs  Lab 10/09/20 0500 10/14/20 1158  LIPASE 38 47   No results for input(s): AMMONIA in the last 168 hours. CBC: Recent Labs  Lab 10/10/20 1120 10/12/20 0326 10/13/20 0844 10/14/20 1158 10/15/20 0412  WBC 16.7* 21.1* 20.6* 15.7* 13.3*  NEUTROABS  --   --   --   12.7* 9.5*  HGB 10.3* 10.6* 10.8* 10.3* 10.5*  HCT 31.2* 32.5* 32.9* 31.8* 32.5*  MCV 94.8 95.3 94.8 95.2 96.2  PLT 168 251 296 304 391   Cardiac Enzymes: No results for input(s): CKTOTAL, CKMB, CKMBINDEX, TROPONINI in the last 168 hours. BNP: Invalid input(s): POCBNP CBG: No results for input(s): GLUCAP in the last 168 hours. D-Dimer No results for input(s): DDIMER in the last 72 hours. Hgb A1c No results for input(s): HGBA1C in the last 72 hours. Lipid Profile No results for input(s): CHOL, HDL, LDLCALC, TRIG, CHOLHDL, LDLDIRECT in the last 72 hours. Thyroid function studies No results for input(s): TSH, T4TOTAL, T3FREE, THYROIDAB in the last 72 hours.  Invalid input(s): FREET3 Anemia work up No results for input(s): VITAMINB12, FOLATE, FERRITIN, TIBC, IRON, RETICCTPCT in the last 72 hours. Urinalysis    Component Value Date/Time   COLORURINE BROWN (A) 10/04/2020 1800   APPEARANCEUR CLOUDY (A) 10/04/2020 1800   LABSPEC >1.030 (H) 10/04/2020 1800   PHURINE 6.5 10/04/2020 1800   GLUCOSEU NEGATIVE 10/04/2020 1800   HGBUR TRACE (A) 10/04/2020 1800   BILIRUBINUR LARGE (A) 10/04/2020 1800   KETONESUR >80 (A) 10/04/2020 1800   PROTEINUR >300 (A) 10/04/2020 1800   UROBILINOGEN 1.0 07/30/2014 1631   NITRITE POSITIVE (A) 10/04/2020 1800   LEUKOCYTESUR NEGATIVE 10/04/2020 1800   Sepsis Labs Invalid input(s): PROCALCITONIN,  WBC,  LACTICIDVEN Microbiology Recent Results (from the past 240 hour(s))  Culture, Urine     Status: Abnormal   Collection Time: 10/06/20 10:32 AM   Specimen: Urine, Catheterized  Result Value Ref Range Status   Specimen Description   Final    URINE, CATHETERIZED Performed at [facility], [address], [place], [place] [address]    Special Requests   Final    NONE Performed at [facility], [address] [place], [place] [address]    Culture MULTIPLE SPECIES PRESENT, SUGGEST RECOLLECTION (A)  Final    Report Status 10/07/2020 FINAL  Final  Culture, blood (Routine X 2) w Reflex to ID Panel     Status: None   Collection Time: 10/06/20  5:14 PM   Specimen: BLOOD  Result Value Ref Range Status   Specimen Description   Final    BLOOD RIGHT ANTECUBITAL Performed at [facility], [address], [place], [place] [address]    Special Requests   Final    BOTTLES DRAWN AEROBIC ONLY Blood Culture adequate volume Performed at [facility], [address] [place], [place] [address]    Culture   Final    NO GROWTH 5 DAYS Performed at [facility], [address], [place], [place] [address]    Report Status 10/11/2020 FINAL  Final  Culture, blood (Routine X 2) w Reflex to ID Panel     Status: None   Collection Time: 10/06/20  5:14 PM   Specimen: BLOOD  Result Value Ref Range Status   Specimen Description   Final    BLOOD LEFT ANTECUBITAL Performed at [facility], [address], [place], [place] [address]    Special Requests   Final    BOTTLES DRAWN AEROBIC ONLY Blood Culture adequate volume Performed at [facility], [address], [place], [place] [address]    Culture   Final    NO GROWTH 5 DAYS Performed at [facility], [address] [place], [place] [address]    Report Status 10/11/2020 FINAL  Final     Time coordinating discharge: 40 minutes  SIGNED:   Dorcas Carrow, MD  [facility] 10/15/2020, 12:48 PM

## 2020-10-15 NOTE — Progress Notes (Signed)
[facility] Gastroenterology Progress Note  Michelle Braun 34 y.o. 04/29/1986  CC: Pancreatitis.   Subjective: Patient feeling better. Tolerating clear liquid diet. Complaining of diarrhea. Abdominal pain improving.  ROS : Afebrile. Negative for chest pain   Objective: Vital signs in last 24 hours: Vitals:   10/15/20 0550 10/15/20 0920  BP: (!) 144/99   Pulse: 94   Resp: 16   Temp: 98.4 F (36.9 C)   SpO2: (!) 89% 98%    Physical Exam:  General:  Alert, cooperative, no distress, appears stated age  Head:  Normocephalic, without obvious abnormality, atraumatic  Eyes:  , EOM's intact,   Lungs:   Clear to auscultation bilaterally, respirations unlabored  Heart:  Regular rate and rhythm, S1, S2 normal  Abdomen:    Generalized abdominal discomfort on palpation, abdomen is soft, bowel sounds present. No peritoneal signs  Extremities: Extremities normal, atraumatic, no  edema  Pulses: 2+ and symmetric    Lab Results: Recent Labs    10/14/20 1158 10/15/20 0412  NA 138 138  K 3.8 3.8  CL 101 103  CO2 25 27  GLUCOSE 107* 111*  BUN <5* <5*  CREATININE 0.69 0.69  CALCIUM 7.9* 8.0*  MG 1.8 2.4  PHOS 3.9 4.1   Recent Labs    10/14/20 1158 10/15/20 0412  AST 21 20  ALT 13 12  ALKPHOS 87 85  BILITOT 1.0 0.5  PROT 5.7* 5.8*  ALBUMIN 2.7* 2.7*   Recent Labs    10/14/20 1158 10/15/20 0412  WBC 15.7* 13.3*  NEUTROABS 12.7* 9.5*  HGB 10.3* 10.5*  HCT 31.8* 32.5*  MCV 95.2 96.2  PLT 304 391   No results for input(s): LABPROT, INR in the last 72 hours.    Assessment/Plan: -Acute necrotizing pancreatitis. 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 Improving  Recommendations ------------------------- -Advance diet to full liquid -Continue supportive care -Hopefully discharge soon. -GI will follow  Kathi Der MD, FACP 10/15/2020, 9:26 AM  Contact #  [phone]

## 2020-10-15 NOTE — Progress Notes (Signed)
[facility] Gastroenterology Progress Note  Taryne Kiger 34 y.o. 10-18-1986  CC: Pancreatitis.   Subjective: Patient feeling better. Tolerating full liquid diet. Diarrhea resolved. No bowel movement today. Abdominal pain improving.  ROS : Afebrile. Negative for chest pain   Objective: Vital signs in last 24 hours: Vitals:   10/15/20 0550 10/15/20 0920  BP: (!) 144/99   Pulse: 94   Resp: 16   Temp: 98.4 F (36.9 C)   SpO2: (!) 89% 98%    Physical Exam:  General:  Alert, cooperative, no distress, appears stated age  Head:  Normocephalic, without obvious abnormality, atraumatic  Eyes:  , EOM's intact,   Lungs:   Clear to auscultation bilaterally, respirations unlabored  Heart:  Regular rate and rhythm, S1, S2 normal  Abdomen:    Generalized abdominal discomfort on palpation, abdomen is soft, bowel sounds present. No peritoneal signs  Extremities: Extremities normal, atraumatic, no  edema  Pulses: 2+ and symmetric    Lab Results: Recent Labs    10/14/20 1158 10/15/20 0412  NA 138 138  K 3.8 3.8  CL 101 103  CO2 25 27  GLUCOSE 107* 111*  BUN <5* <5*  CREATININE 0.69 0.69  CALCIUM 7.9* 8.0*  MG 1.8 2.4  PHOS 3.9 4.1   Recent Labs    10/14/20 1158 10/15/20 0412  AST 21 20  ALT 13 12  ALKPHOS 87 85  BILITOT 1.0 0.5  PROT 5.7* 5.8*  ALBUMIN 2.7* 2.7*   Recent Labs    10/14/20 1158 10/15/20 0412  WBC 15.7* 13.3*  NEUTROABS 12.7* 9.5*  HGB 10.3* 10.5*  HCT 31.8* 32.5*  MCV 95.2 96.2  PLT 304 391   No results for input(s): LABPROT, INR in the last 72 hours.    Assessment/Plan: -Acute necrotizing pancreatitis. 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 Improving  Recommendations ------------------------- -Okay to discharge home from GI standpoint. Okay to be on soft but low-fat diet. -Start  Creon because of necrotizing pancreatitis -She may need a repeat CT scan in 4 weeks for follow-up on necrotizing pancreatitis. -Follow-up with Dr. Dulce Sellar in 2 to 4 weeks  after discharge.  Kathi Der MD, FACP 10/15/2020, 9:28 AM  Contact #  [phone]

## 2020-10-15 NOTE — Plan of Care (Signed)
Discharge instructions were given to Pt. All questions were answered.  

## 2020-10-19 DIAGNOSIS — K8592 Acute pancreatitis with infected necrosis, unspecified: Secondary | ICD-10-CM | POA: Diagnosis not present

## 2020-10-19 DIAGNOSIS — M461 Sacroiliitis, not elsewhere classified: Secondary | ICD-10-CM | POA: Diagnosis not present

## 2020-10-19 DIAGNOSIS — F1010 Alcohol abuse, uncomplicated: Secondary | ICD-10-CM | POA: Diagnosis not present

## 2020-10-19 DIAGNOSIS — R7401 Elevation of levels of liver transaminase levels: Secondary | ICD-10-CM | POA: Diagnosis not present

## 2020-11-17 DIAGNOSIS — M533 Sacrococcygeal disorders, not elsewhere classified: Secondary | ICD-10-CM | POA: Diagnosis not present

## 2020-11-17 DIAGNOSIS — K8521 Alcohol induced acute pancreatitis with uninfected necrosis: Secondary | ICD-10-CM | POA: Diagnosis not present

## 2020-11-17 DIAGNOSIS — G894 Chronic pain syndrome: Secondary | ICD-10-CM | POA: Diagnosis not present

## 2020-11-17 DIAGNOSIS — F1010 Alcohol abuse, uncomplicated: Secondary | ICD-10-CM | POA: Diagnosis not present

## 2020-11-21 IMAGING — CT CT ABD-PELV W/ CM
2 of 4 series · 16 of 46 positions shown, 18 images · IV contrast (Omnipaque)
Comparison: Same day abdominal ultrasound and CT abdomen pelvis
dated 07/30/2014.

CLINICAL DATA: Acute pancreatitis.

EXAM:
CT ABDOMEN AND PELVIS WITH CONTRAST
TECHNIQUE: Multidetector CT imaging of the abdomen and pelvis was performed
using the standard protocol following bolus administration of
intravenous contrast.
CONTRAST:  100mL OMNIPAQUE IOHEXOL 300 MG/ML  SOLN

[Series 2: axial st · axial · 0.86mm/px · z∈[-240,+270]mm · 13 of 112 slices shown, 15 images]
[im 5/112  soft-tissue]
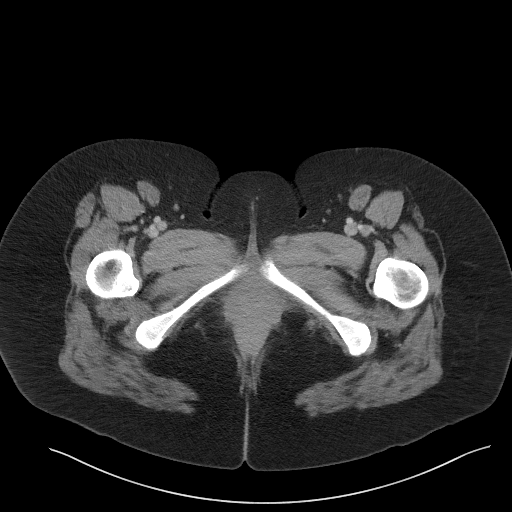
[im 5/112  bone]
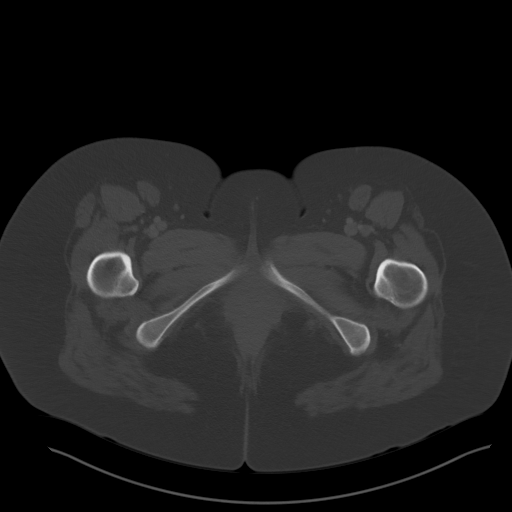
[im 14/112  soft-tissue]
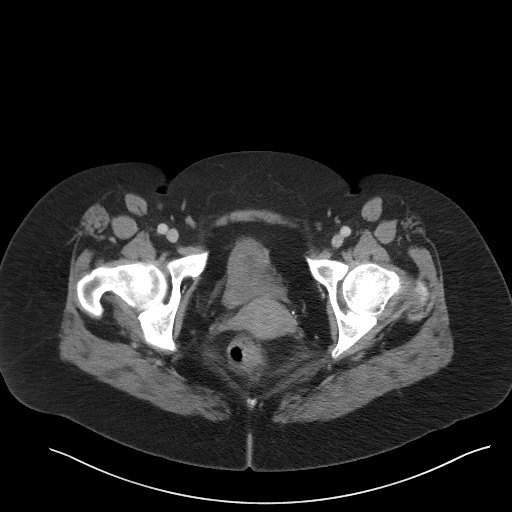
[im 24/112  soft-tissue]
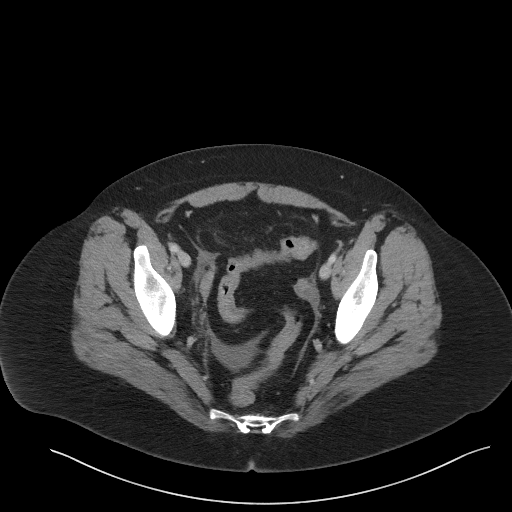
[im 33/112  soft-tissue]
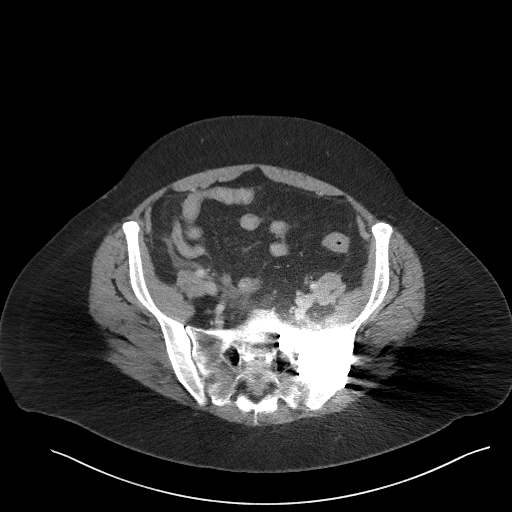
[im 38/112  soft-tissue]
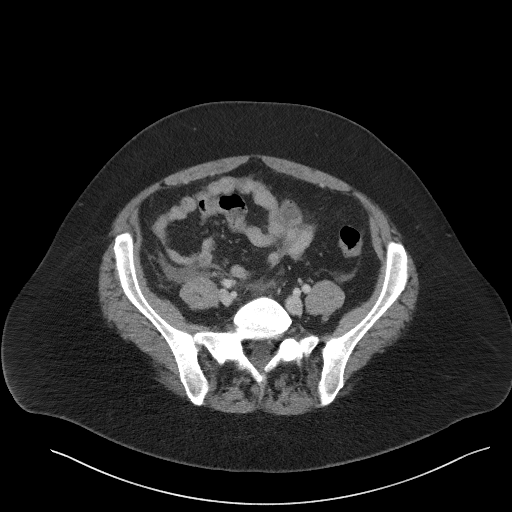
[im 47/112  soft-tissue]
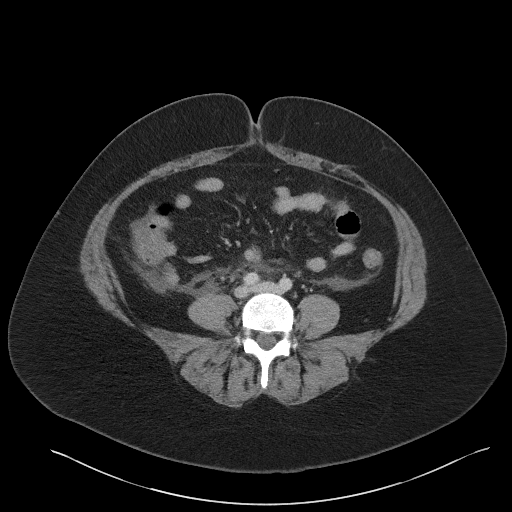
[im 56/112  soft-tissue]
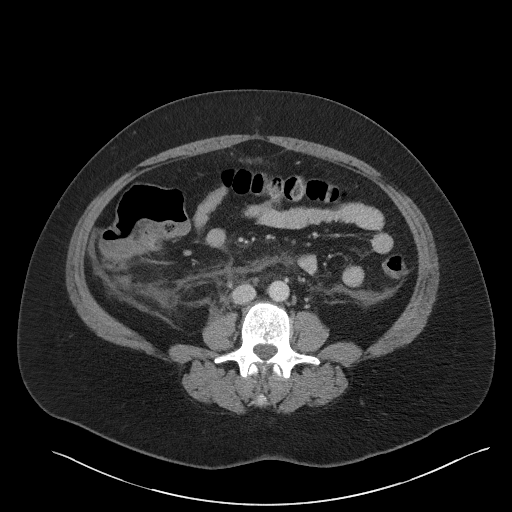
[im 65/112  soft-tissue]
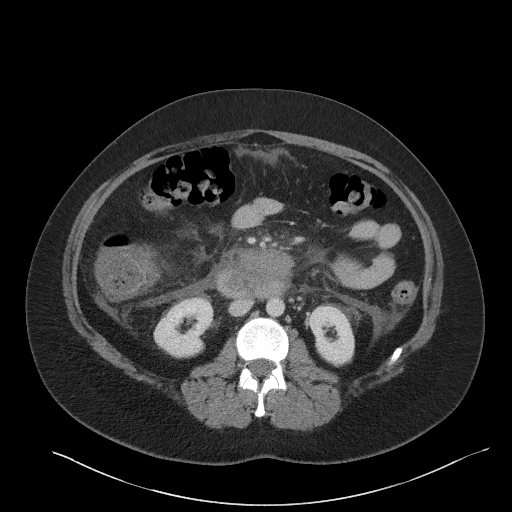
[im 75/112  soft-tissue]
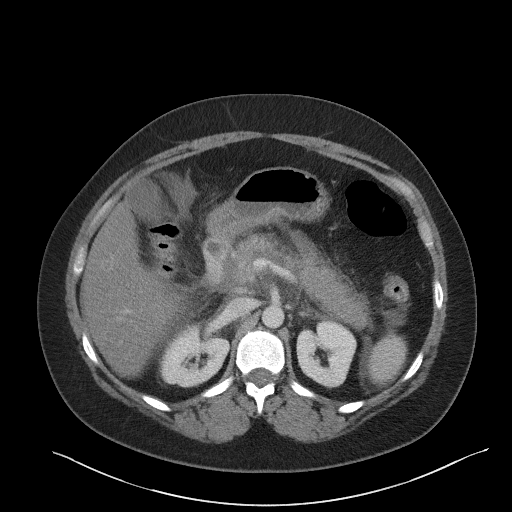
[im 75/112  bone]
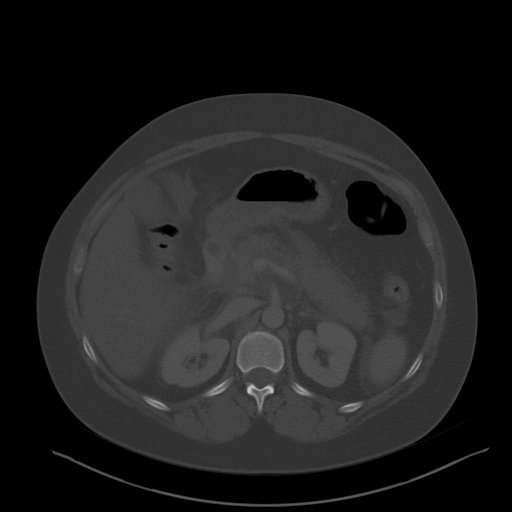
[im 79/112  soft-tissue]
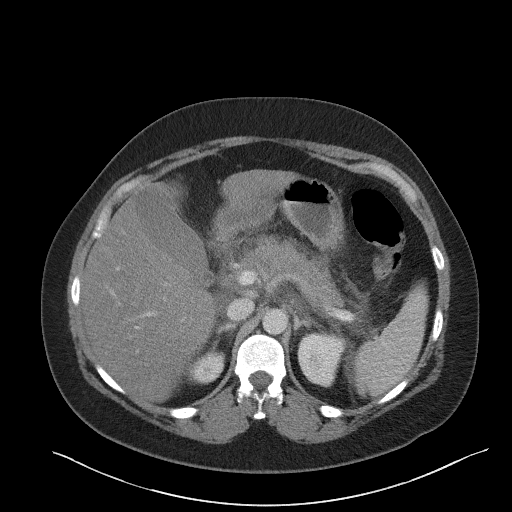
[im 88/112  soft-tissue]
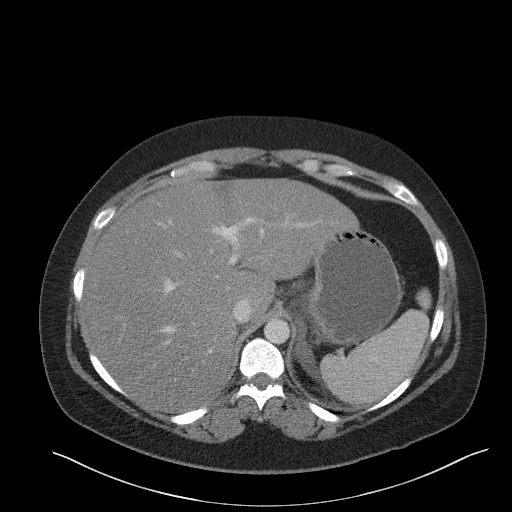
[im 98/112  soft-tissue]
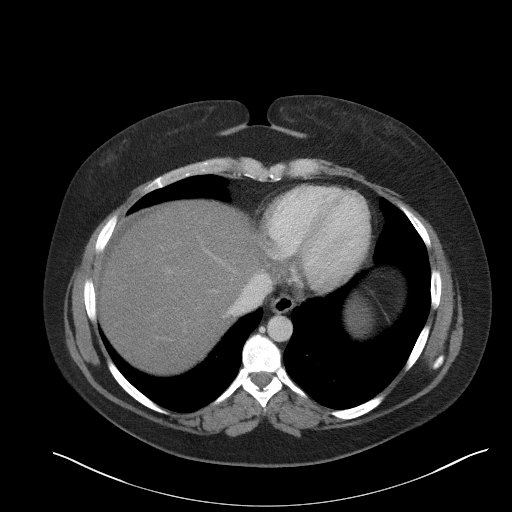
[im 107/112  soft-tissue]
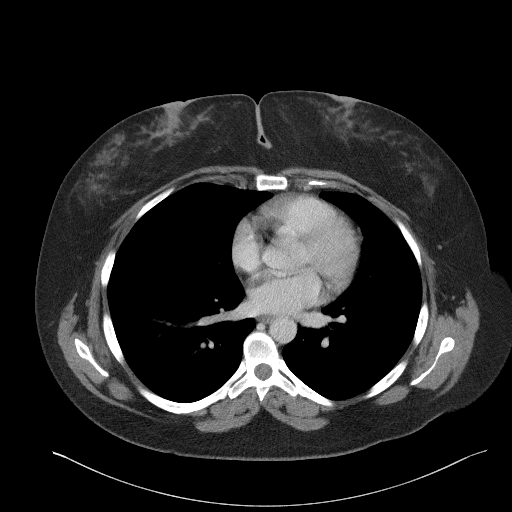

[Series 5: coronal st · coronal · 0.91mm/px · 3 of 116 slices shown]
[im 39/116  soft-tissue]
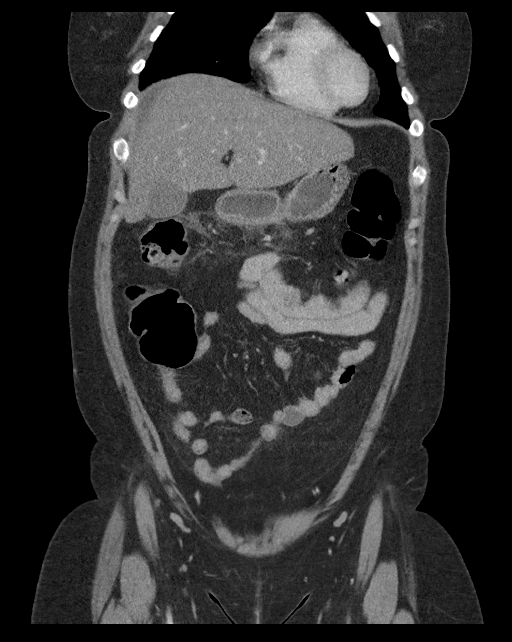
[im 52/116  soft-tissue]
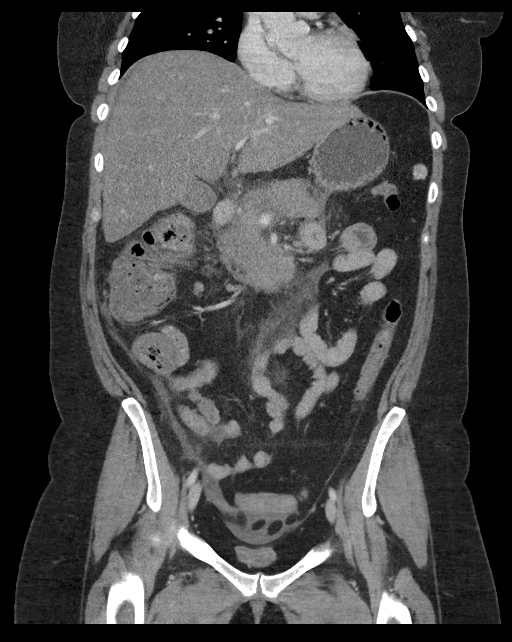
[im 64/116  soft-tissue]
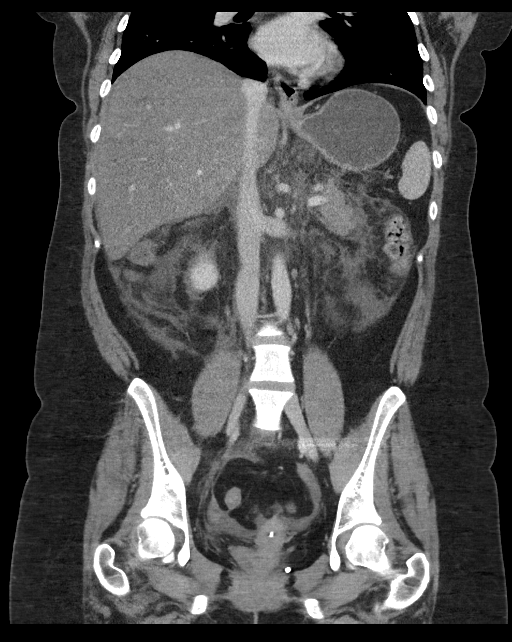

[16 of 46 positions shown; findings below may reference images not displayed]

FINDINGS: Lower chest: No acute abnormality.

Hepatobiliary: The liver is hypoattenuating relative to the spleen,
consistent with hepatic steatosis. No focal liver abnormality is
seen. No gallstones, gallbladder wall thickening, or biliary
dilatation.

Pancreas: There is diffuse edema of the pancreas with significant
surrounding fat stranding and edema. The pancreas parenchyma appears
to enhance homogeneously aside from a 1.3 cm area of relative
hypoattenuation in the pancreatic head/uncinate (series 2, image
40). There is no acute peripancreatic fluid collection or walled off
necrosis. Fluid extends into the peritoneal cavity along the
bilateral pericolic gutters and into the pelvis. No pancreatic
ductal dilatation.

Spleen: Normal in size without focal abnormality.

Adrenals/Urinary Tract: Adrenal glands are unremarkable. Kidneys are
normal, without renal calculi, focal lesion, or hydronephrosis.
Bladder is decompressed.

Stomach/Bowel: Stomach is within normal limits. Appendix appears
normal. No evidence of bowel wall thickening, distention, or
inflammatory changes.

Vascular/Lymphatic: No significant vascular findings are present. No
portal vein thrombosis or splenic artery aneurysm is identified. No
enlarged abdominal or pelvic lymph nodes.

Reproductive: An intrauterine contraceptive device is noted.

Other: No abdominal wall hernia is seen.

Musculoskeletal: Fixation hardware traverses the left sacroiliac
joint.
IMPRESSION: Acute pancreatitis. A small area of the pancreatic head/uncinate
enhances less than the remainder of the pancreatic parenchyma and
may represent hypoenhancement secondary only to edema versus early
necrosis. No acute peripancreatic fluid collection or walled off
necrosis.

## 2020-12-29 DIAGNOSIS — M533 Sacrococcygeal disorders, not elsewhere classified: Secondary | ICD-10-CM | POA: Diagnosis not present

## 2020-12-29 DIAGNOSIS — M25559 Pain in unspecified hip: Secondary | ICD-10-CM | POA: Diagnosis not present

## 2020-12-29 DIAGNOSIS — K8521 Alcohol induced acute pancreatitis with uninfected necrosis: Secondary | ICD-10-CM | POA: Diagnosis not present

## 2020-12-29 DIAGNOSIS — G894 Chronic pain syndrome: Secondary | ICD-10-CM | POA: Diagnosis not present

## 2020-12-29 DIAGNOSIS — F1010 Alcohol abuse, uncomplicated: Secondary | ICD-10-CM | POA: Diagnosis not present

## 2021-03-16 DIAGNOSIS — F411 Generalized anxiety disorder: Secondary | ICD-10-CM | POA: Diagnosis not present

## 2021-03-16 DIAGNOSIS — F331 Major depressive disorder, recurrent, moderate: Secondary | ICD-10-CM | POA: Diagnosis not present

## 2021-03-16 DIAGNOSIS — F909 Attention-deficit hyperactivity disorder, unspecified type: Secondary | ICD-10-CM | POA: Diagnosis not present

## 2021-04-06 DIAGNOSIS — F909 Attention-deficit hyperactivity disorder, unspecified type: Secondary | ICD-10-CM | POA: Diagnosis not present

## 2021-04-06 DIAGNOSIS — F331 Major depressive disorder, recurrent, moderate: Secondary | ICD-10-CM | POA: Diagnosis not present

## 2021-04-06 DIAGNOSIS — F411 Generalized anxiety disorder: Secondary | ICD-10-CM | POA: Diagnosis not present

## 2021-05-18 DIAGNOSIS — K8521 Alcohol induced acute pancreatitis with uninfected necrosis: Secondary | ICD-10-CM | POA: Diagnosis not present

## 2021-05-18 DIAGNOSIS — G894 Chronic pain syndrome: Secondary | ICD-10-CM | POA: Diagnosis not present

## 2021-05-18 DIAGNOSIS — M533 Sacrococcygeal disorders, not elsewhere classified: Secondary | ICD-10-CM | POA: Diagnosis not present

## 2021-05-18 DIAGNOSIS — M25551 Pain in right hip: Secondary | ICD-10-CM | POA: Diagnosis not present

## 2021-05-18 DIAGNOSIS — M25552 Pain in left hip: Secondary | ICD-10-CM | POA: Diagnosis not present

## 2021-05-18 DIAGNOSIS — F1010 Alcohol abuse, uncomplicated: Secondary | ICD-10-CM | POA: Diagnosis not present

## 2021-06-25 DIAGNOSIS — F411 Generalized anxiety disorder: Secondary | ICD-10-CM | POA: Diagnosis not present

## 2021-06-25 DIAGNOSIS — F909 Attention-deficit hyperactivity disorder, unspecified type: Secondary | ICD-10-CM | POA: Diagnosis not present

## 2021-06-25 DIAGNOSIS — F331 Major depressive disorder, recurrent, moderate: Secondary | ICD-10-CM | POA: Diagnosis not present

## 2021-06-29 DIAGNOSIS — M25552 Pain in left hip: Secondary | ICD-10-CM | POA: Diagnosis not present

## 2021-06-29 DIAGNOSIS — M533 Sacrococcygeal disorders, not elsewhere classified: Secondary | ICD-10-CM | POA: Diagnosis not present

## 2021-06-29 DIAGNOSIS — G894 Chronic pain syndrome: Secondary | ICD-10-CM | POA: Diagnosis not present

## 2021-06-29 DIAGNOSIS — M25551 Pain in right hip: Secondary | ICD-10-CM | POA: Diagnosis not present

## 2021-07-09 DIAGNOSIS — M545 Low back pain, unspecified: Secondary | ICD-10-CM | POA: Diagnosis not present

## 2021-07-12 DIAGNOSIS — M2560 Stiffness of unspecified joint, not elsewhere classified: Secondary | ICD-10-CM | POA: Diagnosis not present

## 2021-07-12 DIAGNOSIS — M5432 Sciatica, left side: Secondary | ICD-10-CM | POA: Diagnosis not present

## 2021-07-12 DIAGNOSIS — M545 Low back pain, unspecified: Secondary | ICD-10-CM | POA: Diagnosis not present

## 2021-07-12 DIAGNOSIS — R262 Difficulty in walking, not elsewhere classified: Secondary | ICD-10-CM | POA: Diagnosis not present

## 2021-07-14 DIAGNOSIS — M5432 Sciatica, left side: Secondary | ICD-10-CM | POA: Diagnosis not present

## 2021-07-14 DIAGNOSIS — M545 Low back pain, unspecified: Secondary | ICD-10-CM | POA: Diagnosis not present

## 2021-07-14 DIAGNOSIS — M2560 Stiffness of unspecified joint, not elsewhere classified: Secondary | ICD-10-CM | POA: Diagnosis not present

## 2021-07-14 DIAGNOSIS — R262 Difficulty in walking, not elsewhere classified: Secondary | ICD-10-CM | POA: Diagnosis not present

## 2021-07-16 DIAGNOSIS — M2560 Stiffness of unspecified joint, not elsewhere classified: Secondary | ICD-10-CM | POA: Diagnosis not present

## 2021-07-16 DIAGNOSIS — M5432 Sciatica, left side: Secondary | ICD-10-CM | POA: Diagnosis not present

## 2021-07-16 DIAGNOSIS — R262 Difficulty in walking, not elsewhere classified: Secondary | ICD-10-CM | POA: Diagnosis not present

## 2021-07-16 DIAGNOSIS — M545 Low back pain, unspecified: Secondary | ICD-10-CM | POA: Diagnosis not present

## 2021-07-24 DIAGNOSIS — Z20822 Contact with and (suspected) exposure to covid-19: Secondary | ICD-10-CM | POA: Diagnosis not present

## 2021-07-24 DIAGNOSIS — J00 Acute nasopharyngitis [common cold]: Secondary | ICD-10-CM | POA: Diagnosis not present

## 2021-07-24 DIAGNOSIS — R059 Cough, unspecified: Secondary | ICD-10-CM | POA: Diagnosis not present

## 2021-07-30 DIAGNOSIS — M533 Sacrococcygeal disorders, not elsewhere classified: Secondary | ICD-10-CM | POA: Diagnosis not present

## 2021-07-30 DIAGNOSIS — Z79899 Other long term (current) drug therapy: Secondary | ICD-10-CM | POA: Diagnosis not present

## 2021-07-30 DIAGNOSIS — G894 Chronic pain syndrome: Secondary | ICD-10-CM | POA: Diagnosis not present

## 2021-07-30 DIAGNOSIS — Z5181 Encounter for therapeutic drug level monitoring: Secondary | ICD-10-CM | POA: Diagnosis not present

## 2021-07-30 DIAGNOSIS — M961 Postlaminectomy syndrome, not elsewhere classified: Secondary | ICD-10-CM | POA: Diagnosis not present

## 2021-08-02 DIAGNOSIS — M545 Low back pain, unspecified: Secondary | ICD-10-CM | POA: Diagnosis not present

## 2021-08-02 DIAGNOSIS — M2560 Stiffness of unspecified joint, not elsewhere classified: Secondary | ICD-10-CM | POA: Diagnosis not present

## 2021-08-02 DIAGNOSIS — M5432 Sciatica, left side: Secondary | ICD-10-CM | POA: Diagnosis not present

## 2021-08-02 DIAGNOSIS — R262 Difficulty in walking, not elsewhere classified: Secondary | ICD-10-CM | POA: Diagnosis not present

## 2021-09-08 DIAGNOSIS — M961 Postlaminectomy syndrome, not elsewhere classified: Secondary | ICD-10-CM | POA: Diagnosis not present

## 2021-09-08 DIAGNOSIS — G894 Chronic pain syndrome: Secondary | ICD-10-CM | POA: Diagnosis not present

## 2021-09-08 DIAGNOSIS — M533 Sacrococcygeal disorders, not elsewhere classified: Secondary | ICD-10-CM | POA: Diagnosis not present

## 2021-09-21 DIAGNOSIS — M533 Sacrococcygeal disorders, not elsewhere classified: Secondary | ICD-10-CM | POA: Diagnosis not present

## 2021-09-21 DIAGNOSIS — M25552 Pain in left hip: Secondary | ICD-10-CM | POA: Diagnosis not present

## 2021-09-21 DIAGNOSIS — G894 Chronic pain syndrome: Secondary | ICD-10-CM | POA: Diagnosis not present

## 2021-09-21 DIAGNOSIS — M25551 Pain in right hip: Secondary | ICD-10-CM | POA: Diagnosis not present

## 2021-09-24 DIAGNOSIS — M533 Sacrococcygeal disorders, not elsewhere classified: Secondary | ICD-10-CM | POA: Diagnosis not present

## 2021-09-24 DIAGNOSIS — G894 Chronic pain syndrome: Secondary | ICD-10-CM | POA: Diagnosis not present

## 2021-09-24 DIAGNOSIS — Z5181 Encounter for therapeutic drug level monitoring: Secondary | ICD-10-CM | POA: Diagnosis not present

## 2021-09-24 DIAGNOSIS — F419 Anxiety disorder, unspecified: Secondary | ICD-10-CM | POA: Diagnosis not present

## 2021-09-24 DIAGNOSIS — M961 Postlaminectomy syndrome, not elsewhere classified: Secondary | ICD-10-CM | POA: Diagnosis not present

## 2021-09-24 DIAGNOSIS — Z79899 Other long term (current) drug therapy: Secondary | ICD-10-CM | POA: Diagnosis not present

## 2021-09-30 DIAGNOSIS — G8929 Other chronic pain: Secondary | ICD-10-CM | POA: Diagnosis not present

## 2021-09-30 DIAGNOSIS — R200 Anesthesia of skin: Secondary | ICD-10-CM | POA: Diagnosis not present

## 2021-09-30 DIAGNOSIS — M549 Dorsalgia, unspecified: Secondary | ICD-10-CM | POA: Diagnosis not present

## 2021-09-30 DIAGNOSIS — M79605 Pain in left leg: Secondary | ICD-10-CM | POA: Diagnosis not present
